# Patient Record
Sex: Male | Born: 1995 | Race: Black or African American | Hispanic: No | Marital: Married | Smoking: Never smoker
Health system: Southern US, Community
[De-identification: ages and names within clinical notes are randomized; demographics above are authoritative.]

## PROBLEM LIST (undated history)

## (undated) DIAGNOSIS — T7840XA Allergy, unspecified, initial encounter: Secondary | ICD-10-CM

## (undated) HISTORY — DX: Allergy, unspecified, initial encounter: T78.40XA

## (undated) HISTORY — PX: APPENDECTOMY: SHX54

---

## 2013-01-16 HISTORY — PX: APPENDECTOMY: SHX54

## 2013-10-03 ENCOUNTER — Emergency Department (HOSPITAL_COMMUNITY): Payer: BC Managed Care – PPO

## 2013-10-03 ENCOUNTER — Encounter (HOSPITAL_COMMUNITY): Payer: BC Managed Care – PPO | Admitting: Anesthesiology

## 2013-10-03 ENCOUNTER — Encounter (HOSPITAL_COMMUNITY)
Admission: EM | Disposition: A | Discharge status: Home / Self Care | Payer: Self-pay | Source: Home / Self Care | Attending: Emergency Medicine

## 2013-10-03 ENCOUNTER — Observation Stay (HOSPITAL_COMMUNITY)
Admission: EM | Admit: 2013-10-03 | Discharge: 2013-10-04 | Disposition: A | Discharge status: Home / Self Care | Payer: BC Managed Care – PPO | Attending: General Surgery | Admitting: General Surgery

## 2013-10-03 ENCOUNTER — Encounter (HOSPITAL_COMMUNITY): Payer: Self-pay | Admitting: Emergency Medicine

## 2013-10-03 ENCOUNTER — Emergency Department (HOSPITAL_COMMUNITY): Payer: BC Managed Care – PPO | Admitting: Anesthesiology

## 2013-10-03 DIAGNOSIS — Z91018 Allergy to other foods: Secondary | ICD-10-CM | POA: Diagnosis not present

## 2013-10-03 DIAGNOSIS — K3580 Unspecified acute appendicitis: Principal | ICD-10-CM | POA: Insufficient documentation

## 2013-10-03 HISTORY — DX: Unspecified acute appendicitis: K35.80

## 2013-10-03 HISTORY — PX: LAPAROSCOPIC APPENDECTOMY: SHX408

## 2013-10-03 LAB — CBC
HEMATOCRIT: 46.6 % (ref 39.0–52.0)
HEMOGLOBIN: 16.1 g/dL (ref 13.0–17.0)
MCH: 30 pg (ref 26.0–34.0)
MCHC: 34.5 g/dL (ref 30.0–36.0)
MCV: 86.8 fL (ref 78.0–100.0)
Platelets: 213 10*3/uL (ref 150–400)
RBC: 5.37 MIL/uL (ref 4.22–5.81)
RDW: 12.5 % (ref 11.5–15.5)
WBC: 9.4 10*3/uL (ref 4.0–10.5)

## 2013-10-03 LAB — URINALYSIS, ROUTINE W REFLEX MICROSCOPIC
Bilirubin Urine: NEGATIVE
GLUCOSE, UA: NEGATIVE mg/dL
Hgb urine dipstick: NEGATIVE
KETONES UR: NEGATIVE mg/dL
Leukocytes, UA: NEGATIVE
Nitrite: NEGATIVE
PH: 6 (ref 5.0–8.0)
Protein, ur: NEGATIVE mg/dL
Specific Gravity, Urine: 1.02 (ref 1.005–1.030)
Urobilinogen, UA: 0.2 mg/dL (ref 0.0–1.0)

## 2013-10-03 LAB — BASIC METABOLIC PANEL
Anion gap: 15 (ref 5–15)
BUN: 13 mg/dL (ref 6–23)
CALCIUM: 9.9 mg/dL (ref 8.4–10.5)
CO2: 26 meq/L (ref 19–32)
Chloride: 100 mEq/L (ref 96–112)
Creatinine, Ser: 1.11 mg/dL (ref 0.50–1.35)
GFR calc Af Amer: >90 mL/min (ref >90)
GFR calc non Af Amer: >90 mL/min (ref >90)
GLUCOSE: 85 mg/dL (ref 70–99)
Potassium: 3.5 mEq/L — ABNORMAL LOW (ref 3.7–5.3)
SODIUM: 141 meq/L (ref 137–147)

## 2013-10-03 SURGERY — APPENDECTOMY, LAPAROSCOPIC
Anesthesia: General

## 2013-10-03 MED ORDER — BUPIVACAINE HCL (PF) 0.5 % IJ SOLN
INTRAMUSCULAR | Status: AC
  Filled 2013-10-03: qty 30

## 2013-10-03 MED ORDER — LACTATED RINGERS IV SOLN: INTRAVENOUS | Status: DC

## 2013-10-03 MED ORDER — FENTANYL CITRATE 0.05 MG/ML IJ SOLN
INTRAMUSCULAR | Status: AC
  Filled 2013-10-03: qty 5

## 2013-10-03 MED ORDER — LIDOCAINE HCL (PF) 1 % IJ SOLN
INTRAMUSCULAR | Status: AC
  Filled 2013-10-03: qty 5

## 2013-10-03 MED ORDER — POVIDONE-IODINE 10 % EX OINT
TOPICAL_OINTMENT | CUTANEOUS | Status: AC
  Filled 2013-10-03: qty 1

## 2013-10-03 MED ORDER — SODIUM CHLORIDE 0.9 % IV SOLN
INTRAVENOUS | Status: DC | PRN
  Administered 2013-10-03: 21:00:00 via INTRAVENOUS

## 2013-10-03 MED ORDER — MIDAZOLAM HCL 5 MG/5ML IJ SOLN
INTRAMUSCULAR | Status: DC | PRN
  Administered 2013-10-03: 2 mg via INTRAVENOUS

## 2013-10-03 MED ORDER — SUCCINYLCHOLINE CHLORIDE 20 MG/ML IJ SOLN
INTRAMUSCULAR | Status: DC | PRN
  Administered 2013-10-03: 20 mg via INTRAVENOUS
  Administered 2013-10-03 (×2): 140 mg via INTRAVENOUS

## 2013-10-03 MED ORDER — ONDANSETRON HCL 4 MG/2ML IJ SOLN
INTRAMUSCULAR | Status: DC | PRN
  Administered 2013-10-03: 4 mg via INTRAVENOUS

## 2013-10-03 MED ORDER — KETOROLAC TROMETHAMINE 30 MG/ML IJ SOLN
30.0000 mg | Freq: Once | INTRAMUSCULAR | Status: AC
  Administered 2013-10-03: 30 mg via INTRAVENOUS
  Filled 2013-10-03: qty 1

## 2013-10-03 MED ORDER — FENTANYL CITRATE 0.05 MG/ML IJ SOLN
INTRAMUSCULAR | Status: DC | PRN
  Administered 2013-10-03: 50 ug via INTRAVENOUS
  Administered 2013-10-03: 100 ug via INTRAVENOUS
  Administered 2013-10-03 (×3): 50 ug via INTRAVENOUS

## 2013-10-03 MED ORDER — EPHEDRINE SULFATE 50 MG/ML IJ SOLN
INTRAMUSCULAR | Status: AC
Start: 2013-10-03 — End: 2013-10-03
  Filled 2013-10-03: qty 1

## 2013-10-03 MED ORDER — PROPOFOL 10 MG/ML IV BOLUS
INTRAVENOUS | Status: DC | PRN
  Administered 2013-10-03: 200 mg via INTRAVENOUS
  Administered 2013-10-03: 50 mg via INTRAVENOUS

## 2013-10-03 MED ORDER — LACTATED RINGERS IV SOLN
INTRAVENOUS | Status: DC | PRN
  Administered 2013-10-03: 21:00:00 via INTRAVENOUS

## 2013-10-03 MED ORDER — MORPHINE SULFATE 4 MG/ML IJ SOLN
2.0000 mg | Freq: Once | INTRAMUSCULAR | Status: AC
  Administered 2013-10-03: 2 mg via INTRAVENOUS
  Filled 2013-10-03: qty 1

## 2013-10-03 MED ORDER — ATROPINE SULFATE 0.4 MG/ML IJ SOLN
INTRAMUSCULAR | Status: AC
  Filled 2013-10-03: qty 1

## 2013-10-03 MED ORDER — BUPIVACAINE HCL (PF) 0.5 % IJ SOLN
INTRAMUSCULAR | Status: DC | PRN
  Administered 2013-10-03: 7 mL

## 2013-10-03 MED ORDER — MORPHINE SULFATE 4 MG/ML IJ SOLN
INTRAMUSCULAR | Status: AC
  Filled 2013-10-03: qty 1

## 2013-10-03 MED ORDER — IOHEXOL 300 MG/ML  SOLN
100.0000 mL | Freq: Once | INTRAMUSCULAR | Status: AC | PRN
  Administered 2013-10-03: 100 mL via INTRAVENOUS

## 2013-10-03 MED ORDER — SODIUM CHLORIDE 0.9 % IV SOLN
1.5000 g | Freq: Once | INTRAVENOUS | Status: AC
  Administered 2013-10-03: 1.5 g via INTRAVENOUS
  Filled 2013-10-03: qty 1.5

## 2013-10-03 MED ORDER — LIDOCAINE HCL (CARDIAC) 20 MG/ML IV SOLN
INTRAVENOUS | Status: DC | PRN
  Administered 2013-10-03: 50 mg via INTRAVENOUS

## 2013-10-03 MED ORDER — ROCURONIUM BROMIDE 50 MG/5ML IV SOLN
INTRAVENOUS | Status: AC
  Filled 2013-10-03: qty 1

## 2013-10-03 MED ORDER — SUCCINYLCHOLINE CHLORIDE 20 MG/ML IJ SOLN
INTRAMUSCULAR | Status: AC
  Filled 2013-10-03: qty 1

## 2013-10-03 MED ORDER — SODIUM CHLORIDE 0.9 % IJ SOLN
INTRAMUSCULAR | Status: AC
  Filled 2013-10-03: qty 10

## 2013-10-03 MED ORDER — HYDROMORPHONE HCL 1 MG/ML IJ SOLN: 1.0000 mg | INTRAMUSCULAR | Status: DC | PRN

## 2013-10-03 MED ORDER — POVIDONE-IODINE 10 % EX OINT
TOPICAL_OINTMENT | CUTANEOUS | Status: DC | PRN
  Administered 2013-10-03: 1 via TOPICAL

## 2013-10-03 MED ORDER — MIDAZOLAM HCL 2 MG/2ML IJ SOLN
INTRAMUSCULAR | Status: AC
  Filled 2013-10-03: qty 2

## 2013-10-03 MED ORDER — ONDANSETRON HCL 4 MG PO TABS: 4.0000 mg | ORAL_TABLET | Freq: Four times a day (QID) | ORAL | Status: DC | PRN

## 2013-10-03 MED ORDER — ENOXAPARIN SODIUM 40 MG/0.4ML ~~LOC~~ SOLN
40.0000 mg | SUBCUTANEOUS | Status: DC
  Administered 2013-10-04: 40 mg via SUBCUTANEOUS
  Filled 2013-10-03: qty 0.4

## 2013-10-03 MED ORDER — MORPHINE SULFATE 4 MG/ML IJ SOLN
4.0000 mg | Freq: Once | INTRAMUSCULAR | Status: AC
  Administered 2013-10-03: 4 mg via INTRAVENOUS

## 2013-10-03 MED ORDER — PIPERACILLIN-TAZOBACTAM 3.375 G IVPB
3.3750 g | Freq: Three times a day (TID) | INTRAVENOUS | Status: DC
  Administered 2013-10-04 (×2): 3.375 g via INTRAVENOUS
  Filled 2013-10-03 (×8): qty 50

## 2013-10-03 MED ORDER — NEOSTIGMINE METHYLSULFATE 10 MG/10ML IV SOLN
INTRAVENOUS | Status: DC | PRN
  Administered 2013-10-03: 4 mg via INTRAVENOUS

## 2013-10-03 MED ORDER — OXYCODONE-ACETAMINOPHEN 5-325 MG PO TABS: 1.0000 | ORAL_TABLET | ORAL | Status: DC | PRN

## 2013-10-03 MED ORDER — ONDANSETRON HCL 4 MG/2ML IJ SOLN: 4.0000 mg | Freq: Four times a day (QID) | INTRAMUSCULAR | Status: DC | PRN

## 2013-10-03 MED ORDER — SODIUM CHLORIDE 0.9 % IR SOLN
Status: DC | PRN
  Administered 2013-10-03: 1000 mL

## 2013-10-03 MED ORDER — IOHEXOL 300 MG/ML  SOLN
50.0000 mL | Freq: Once | INTRAMUSCULAR | Status: AC | PRN
  Administered 2013-10-03: 50 mL via ORAL

## 2013-10-03 MED ORDER — SODIUM CHLORIDE 0.9 % IV SOLN
Freq: Once | INTRAVENOUS | Status: AC
  Administered 2013-10-03: 18:00:00 via INTRAVENOUS

## 2013-10-03 MED ORDER — GLYCOPYRROLATE 0.2 MG/ML IJ SOLN
INTRAMUSCULAR | Status: DC | PRN
  Administered 2013-10-03: 0.6 mg via INTRAVENOUS

## 2013-10-03 MED ORDER — PROPOFOL 10 MG/ML IV EMUL
INTRAVENOUS | Status: AC
  Filled 2013-10-03: qty 40

## 2013-10-03 SURGICAL SUPPLY — 50 items
BAG HAMPER (MISCELLANEOUS) ×3 IMPLANT
CLOTH BEACON ORANGE TIMEOUT ST (SAFETY) ×3 IMPLANT
COVER LIGHT HANDLE STERIS (MISCELLANEOUS) ×6 IMPLANT
CUTTER FLEX LINEAR 45M (STAPLE) IMPLANT
CUTTER LINEAR ENDO 35 ETS (STAPLE) IMPLANT
CUTTER LINEAR ENDO 35 ETS TH (STAPLE) ×3 IMPLANT
DECANTER SPIKE VIAL GLASS SM (MISCELLANEOUS) ×3 IMPLANT
DISSECTOR BLUNT TIP ENDO 5MM (MISCELLANEOUS) IMPLANT
DURAPREP 26ML APPLICATOR (WOUND CARE) ×3 IMPLANT
ELECT REM PT RETURN 9FT ADLT (ELECTROSURGICAL) ×3
ELECTRODE REM PT RTRN 9FT ADLT (ELECTROSURGICAL) ×1 IMPLANT
FILTER SMOKE EVAC LAPAROSHD (FILTER) ×3 IMPLANT
FORMALIN 10 PREFIL 120ML (MISCELLANEOUS) ×3 IMPLANT
GLOVE BIOGEL PI IND STRL 7.0 (GLOVE) ×1 IMPLANT
GLOVE BIOGEL PI IND STRL 7.5 (GLOVE) ×3 IMPLANT
GLOVE BIOGEL PI INDICATOR 7.0 (GLOVE) ×2
GLOVE BIOGEL PI INDICATOR 7.5 (GLOVE) ×6
GLOVE ECLIPSE 7.0 STRL STRAW (GLOVE) ×3 IMPLANT
GLOVE SURG SS PI 7.5 STRL IVOR (GLOVE) ×6 IMPLANT
GOWN STRL REUS W/ TWL XL LVL3 (GOWN DISPOSABLE) ×1 IMPLANT
GOWN STRL REUS W/TWL LRG LVL3 (GOWN DISPOSABLE) ×3 IMPLANT
GOWN STRL REUS W/TWL XL LVL3 (GOWN DISPOSABLE) ×2
INST SET LAPROSCOPIC AP (KITS) ×3 IMPLANT
IV NS IRRIG 3000ML ARTHROMATIC (IV SOLUTION) IMPLANT
KIT ROOM TURNOVER APOR (KITS) ×3 IMPLANT
MANIFOLD NEPTUNE II (INSTRUMENTS) ×3 IMPLANT
NEEDLE INSUFFLATION 14GA 120MM (NEEDLE) ×3 IMPLANT
NS IRRIG 1000ML POUR BTL (IV SOLUTION) ×3 IMPLANT
PACK LAP CHOLE LZT030E (CUSTOM PROCEDURE TRAY) ×3 IMPLANT
PAD ARMBOARD 7.5X6 YLW CONV (MISCELLANEOUS) ×3 IMPLANT
PENCIL HANDSWITCHING (ELECTRODE) ×3 IMPLANT
POUCH SPECIMEN RETRIEVAL 10MM (ENDOMECHANICALS) ×3 IMPLANT
RELOAD /EVU35 (ENDOMECHANICALS) IMPLANT
RELOAD 45 VASCULAR/THIN (ENDOMECHANICALS) IMPLANT
RELOAD CUTTER ETS 35MM STAND (ENDOMECHANICALS) IMPLANT
SCALPEL HARMONIC ACE (MISCELLANEOUS) ×3 IMPLANT
SET BASIN LINEN APH (SET/KITS/TRAYS/PACK) ×3 IMPLANT
SET TUBE IRRIG SUCTION NO TIP (IRRIGATION / IRRIGATOR) IMPLANT
SPONGE GAUZE 2X2 8PLY STER LF (GAUZE/BANDAGES/DRESSINGS) ×3
SPONGE GAUZE 2X2 8PLY STRL LF (GAUZE/BANDAGES/DRESSINGS) ×6 IMPLANT
STAPLER VISISTAT (STAPLE) ×3 IMPLANT
SUT VICRYL 0 UR6 27IN ABS (SUTURE) ×3 IMPLANT
TAPE CLOTH SURG 4X10 WHT LF (GAUZE/BANDAGES/DRESSINGS) ×3 IMPLANT
TRAY FOLEY CATH 16FR SILVER (SET/KITS/TRAYS/PACK) ×3 IMPLANT
TROCAR ENDO BLADELESS 11MM (ENDOMECHANICALS) ×3 IMPLANT
TROCAR ENDO BLADELESS 12MM (ENDOMECHANICALS) ×3 IMPLANT
TROCAR XCEL NON-BLD 5MMX100MML (ENDOMECHANICALS) ×3 IMPLANT
TUBING INSUFFLATION (TUBING) ×3 IMPLANT
WARMER LAPAROSCOPE (MISCELLANEOUS) ×3 IMPLANT
YANKAUER SUCT 12FT TUBE ARGYLE (SUCTIONS) ×3 IMPLANT

## 2013-10-03 NOTE — Anesthesia Postprocedure Evaluation (Signed)
  Anesthesia Post-op Note  Patient: Ricky Murphy  Procedure(s) Performed: Procedure(s): APPENDECTOMY LAPAROSCOPIC (N/A)  Patient Location: PACU  Anesthesia Type:General  Level of Consciousness: sedated and patient cooperative  Airway and Oxygen Therapy: Patient Spontanous Breathing and Patient connected to face mask oxygen  Post-op Pain: mild  Post-op Assessment: Post-op Vital signs reviewed, Patient's Cardiovascular Status Stable, Respiratory Function Stable, Patent Airway, No signs of Nausea or vomiting and Pain level controlled  Post-op Vital Signs: Reviewed and stable  Last Vitals:  Filed Vitals:   10/03/13 1840  BP: 129/71  Pulse: 86  Temp: 37.7 C  Resp: 18    Complications: No apparent anesthesia complications

## 2013-10-03 NOTE — ED Notes (Signed)
Patient to have surgery this evening as per order. Consent signed, family at bedside

## 2013-10-03 NOTE — Anesthesia Preprocedure Evaluation (Signed)
Anesthesia Evaluation  Patient identified by MRN, date of birth, ID band Patient awake    Reviewed: Allergy & Precautions, H&P , NPO status , Patient's Chart, lab work & pertinent test results  History of Anesthesia Complications Negative for: history of anesthetic complications  Airway Mallampati: II TM Distance: >3 FB Neck ROM: full    Dental  (+) Teeth Intact   Pulmonary    Pulmonary exam normal       Cardiovascular Rhythm:regular Rate:Normal     Neuro/Psych    GI/Hepatic   Endo/Other    Renal/GU      Musculoskeletal   Abdominal   Peds  Hematology   Anesthesia Other Findings   Reproductive/Obstetrics                           Anesthesia Physical Anesthesia Plan  ASA: I and emergent  Anesthesia Plan: General ETT, Rapid Sequence and Cricoid Pressure   Post-op Pain Management:    Induction:   Airway Management Planned:   Additional Equipment:   Intra-op Plan:   Post-operative Plan:   Informed Consent: I have reviewed the patients History and Physical, chart, labs and discussed the procedure including the risks, benefits and alternatives for the proposed anesthesia with the patient or authorized representative who has indicated his/her understanding and acceptance.   Dental Advisory Given  Plan Discussed with: Surgeon and Anesthesiologist  Anesthesia Plan Comments:         Anesthesia Quick Evaluation

## 2013-10-03 NOTE — Transfer of Care (Signed)
Immediate Anesthesia Transfer of Care Note  Patient: Ricky Murphy  Procedure(s) Performed: Procedure(s): APPENDECTOMY LAPAROSCOPIC (N/A)  Patient Location: PACU  Anesthesia Type:General  Level of Consciousness: sedated and patient cooperative  Airway & Oxygen Therapy: Patient Spontanous Breathing and Patient connected to face mask oxygen  Post-op Assessment: Report given to PACU RN and Post -op Vital signs reviewed and stable  Post vital signs: Reviewed and stable  Complications: No apparent anesthesia complications

## 2013-10-03 NOTE — ED Notes (Signed)
Pt reports right sided abdominal pain that started at 6am this am. nad noted. Pt reports nausea. nad noted.

## 2013-10-03 NOTE — ED Notes (Signed)
Operating room staff at bedside with patient and family at this time

## 2013-10-03 NOTE — Progress Notes (Signed)
Incentive placed in room, PT asleep

## 2013-10-03 NOTE — ED Provider Notes (Signed)
CSN: [id number]     Arrival date & time 10/03/13  1659 History   First MD Initiated Contact with Patient 10/03/13 1713     Chief Complaint  Patient presents with  . Abdominal Pain     (Consider location/radiation/quality/duration/timing/severity/associated sxs/prior Treatment) HPI Comments: The pt is a 18 y/o male - no PMH - presents with RLQ abd pain - started this AM, onset was acute - constant throughout day and associated with 3 episodes of watery non bloody diarrhea and mild nausea - he has no fevers.  yesterday evening he developed URI sx with nasal congestion, sore throat, mild cough and headache.  These are still present today.  He has no hx of abd surgery.  Patient is a 18 y.o. male presenting with abdominal pain. The history is provided by the patient.  Abdominal Pain   History reviewed. No pertinent past medical history. History reviewed. No pertinent past surgical history. History reviewed. No pertinent family history. History  Substance Use Topics  . Smoking status: Never Smoker   . Smokeless tobacco: Not on file  . Alcohol Use: No    Review of Systems  Gastrointestinal: Positive for abdominal pain.  All other systems reviewed and are negative.     Allergies  Coconut flavor  Home Medications   Prior to Admission medications   Not on File   BP 129/71  Pulse 86  Temp(Src) 99.9 F (37.7 C) (Oral)  Resp 18  Ht  (1.727 m)  Wt 245 lb (111.131 kg)  BMI 37.26 kg/m2  SpO2 98% Physical Exam  Nursing note and vitals reviewed. Constitutional: He appears well-developed and well-nourished. No distress.  HENT:  Head: Normocephalic and atraumatic.  Nose: Nose normal.  Mouth/Throat: Mucous membranes are normal. No oral lesions. No trismus in the jaw. No uvula swelling. Posterior oropharyngeal erythema present. No oropharyngeal exudate, posterior oropharyngeal edema or tonsillar abscesses.  Eyes: Conjunctivae and EOM are normal. Pupils are equal, round, and  reactive to light. Right eye exhibits no discharge. Left eye exhibits no discharge. No scleral icterus.  Neck: Normal range of motion. Neck supple. No JVD present. No thyromegaly present.  Cardiovascular: Normal rate, regular rhythm, normal heart sounds and intact distal pulses.  Exam reveals no gallop and no friction rub.   No murmur heard. Pulmonary/Chest: Effort normal and breath sounds normal. No respiratory distress. He has no wheezes. He has no rales.  Abdominal: Soft. Bowel sounds are normal. He exhibits no distension and no mass. There is no hepatosplenomegaly. There is tenderness in the right lower quadrant. There is tenderness at McBurney's point. There is no rigidity, no rebound, no guarding, no CVA tenderness and negative Murphy's sign. No hernia.  Musculoskeletal: Normal range of motion. He exhibits no edema and no tenderness.  Lymphadenopathy:    He has no cervical adenopathy.  Neurological: He is alert. Coordination normal.  Skin: Skin is warm and dry. No rash noted. No erythema.  Psychiatric: He has a normal mood and affect. His behavior is normal.    ED Course  Procedures (including critical care time) Labs Review Labs Reviewed  BASIC METABOLIC PANEL - Abnormal; Notable for the following:    Potassium 3.5 (*)    All other components within normal limits  CBC  URINALYSIS, ROUTINE W REFLEX MICROSCOPIC    Imaging Review Ct Abdomen Pelvis W Contrast  10/03/2013   CLINICAL DATA:  Right lower quadrant pain  EXAM: CT ABDOMEN AND PELVIS WITH CONTRAST  TECHNIQUE: Multidetector CT imaging of  the abdomen and pelvis was performed using the standard protocol following bolus administration of intravenous contrast.  CONTRAST:  OMNIPAQUE IOHEXOL 300 MG/ML  SOLN  COMPARISON:  None.  FINDINGS: Lower chest:  Lung bases are clear.  Hepatobiliary: Liver is within normal limits.  Gallbladder is unremarkable. No intrahepatic or extrahepatic ductal dilatation.  Spleen: Within normal limits.   Pancreas: Within normal limits.  Stomach/Bowel: Stomach is unremarkable.  No evidence of bowel obstruction.  Distal appendix is mildly dilated, measuring 9 mm, with associated mild periappendiceal stranding/ inflammatory changes (series 2/image 64). This appearance is worrisome for early acute appendicitis.  No drainable fluid collection/ abscess.  No free air.  Adrenals/urinary tract: Adrenal glands are unremarkable.  Kidneys are within normal limits.  No hydronephrosis.  Bladder is underdistended.  Vascular/Lymphatic: No evidence of abdominal aortic aneurysm.  No suspicious abdominopelvic lymphadenopathy.  Reproductive: Process is unremarkable.  Musculoskeletal: Visualized osseous structures are within normal limits.  Other: No abdominopelvic ascites.  IMPRESSION: Suspected early acute appendicitis.  No drainable fluid collection/abscess.  No free air.   Electronically Signed   By: Charline Bills M.D.   On: 10/03/2013 18:18      MDM   Final diagnoses:  Acute appendicitis, unspecified acute appendicitis type    At this time though the pt has had URI sx and has had diarrhea, he has pain at McB point raising concern for appendicitis.  He does not have an acute abdomen.  His VS are normal - he has agreed to CT to r/o appendicitis.  Pain meds and fluids ordered as below.    CT scan results at 6:45 PM, consistent with acute appendicitis, patient reevaluated, no peritoneal signs, white blood cell count slightly elevated, these findings were discussed with Dr. Lovell Sheehan. Pain medications ordered, n.p.o. status verified, antibiotics ordered as below.  Meds given in ED:  Medications  ampicillin-sulbactam (UNASYN) 1.5 g in sodium chloride 0.9 % 50 mL IVPB (1.5 g Intravenous New Bag/Given 10/03/13 1852)  morphine 4 MG/ML injection (not administered)  morphine 4 MG/ML injection 2 mg (2 mg Intravenous Given 10/03/13 1735)  0.9 %  sodium chloride infusion ( Intravenous New Bag/Given 10/03/13 1735)  iohexol  (OMNIPAQUE) 300 MG/ML solution 50 mL (50 mLs Oral Contrast Given 10/03/13 1725)  iohexol (OMNIPAQUE) 300 MG/ML solution 100 mL (100 mLs Intravenous Contrast Given 10/03/13 1759)  morphine 4 MG/ML injection 4 mg (4 mg Intravenous Given 10/03/13 1853)    New Prescriptions   No medications on file        Vida Roller, MD 10/03/13 [phone]

## 2013-10-03 NOTE — Anesthesia Procedure Notes (Signed)
Procedure Name: Intubation Date/Time: 10/03/2013 8:36 PM Performed by: Pernell Dupre, AMY A Pre-anesthesia Checklist: Patient identified, Patient being monitored, Timeout performed, Emergency Drugs available and Suction available Patient Re-evaluated:Patient Re-evaluated prior to inductionOxygen Delivery Method: Circle System Utilized Preoxygenation: Pre-oxygenation with 100% oxygen Intubation Type: IV induction, Rapid sequence and Cricoid Pressure applied Laryngoscope Size: 3 and Miller Grade View: Grade I Tube type: Oral Tube size: 7.0 mm Number of attempts: 1 Airway Equipment and Method: stylet Placement Confirmation: ETT inserted through vocal cords under direct vision,  positive ETCO2 and breath sounds checked- equal and bilateral Secured at: 21 cm Tube secured with: Tape Dental Injury: Teeth and Oropharynx as per pre-operative assessment

## 2013-10-03 NOTE — Op Note (Signed)
Patient:  Ricky Murphy  DOB:  08-11-1995  MRN:  [id number]   Preop Diagnosis:  Acute appendicitis  Postop Diagnosis:  Same  Procedure:  Laparoscopic appendectomy  Surgeon:  Franky Macho, M.D.  Anes:  General endotracheal  Indications:  Patient is a 18 year old black male who presents with a less than 24-hour history of worsening right lower quadrant abdominal pain. CT scan the abdomen reveals acute appendicitis. The risks and benefits of the procedure including bleeding, infection, and the possibility of an open procedure were fully explained to the patient, who gave informed consent.  Procedure note:  Patient is placed the supine position. After induction of general endotracheal anesthesia, the abdomen was prepped and draped using usual sterile technique with DuraPrep. Surgical site confirmation was performed.  A supraumbilical incision was made down the fascia. A Veress needle was introduced into the abdominal cavity and confirmation of placement was done using the saline drop test. The abdomen was then insufflated to 16 mm mercury pressure. An 11 mm trocar was introduced into the abdominal cavity under direct visualization without difficulty. The patient was placed in deeper Trendelenburg position and an additional 12 mm trocar was placed the suprapubic region and a 5 mm trocar was placed left lower quadrant region. The appendix was visualized and distal half was noted to be injected and swollen. The mesoappendix was divided using the harmonic scalpel. A vascular Endo GIA was placed across the base the appendix and fired. The appendix was then removed using an Endo Catch bag without difficulty. The staple line was inspected and noted to be within normal limits. All fluid and air were then evacuated from the abdominal cavity prior to removal of the trochars.  All wounds were irrigated with normal saline. All wounds were checked with 0.5% Sensorcaine. The supraumbilical fascia was  reapproximated using an 0 Vicryl interrupted suture. All skin incisions were closed using staples. Ointment and dry sterile dressings were applied.  All tape and needle counts were correct at the end of the procedure. Patient was extubated in the operating room and transferred to PACU in stable condition.  Complications:  None  EBL:  Minimal  Specimen:  Appendix

## 2013-10-03 NOTE — H&P (Signed)
Ricky Murphy is an 18 y.o. male.   Chief Complaint: Abdominal pain and generalized malaise HPI: Patient is 18 year old black male who presents with a less than 24-hour history of worsening abdominal pain. CT scan the abdomen reveals early acute appendicitis.  History reviewed. No pertinent past medical history.  History reviewed. No pertinent past surgical history.  History reviewed. No pertinent family history. Social History:  reports that he has never smoked. He does not have any smokeless tobacco history on file. He reports that he uses illicit drugs. He reports that he does not drink alcohol.  Allergies:  Allergies  Allergen Reactions  . Coconut Flavor Shortness Of Breath     (Not in a hospital admission)  Results for orders placed during the hospital encounter of 10/03/13 (from the past 48 hour(s))  CBC     Status: None   Collection Time    10/03/13  5:25 PM      Result Value Ref Range   WBC 9.4  4.0 - 10.5 K/uL   RBC 5.37  4.22 - 5.81 MIL/uL   Hemoglobin 16.1  13.0 - 17.0 g/dL   HCT 46.6  39.0 - 52.0 %   MCV 86.8  78.0 - 100.0 fL   MCH 30.0  26.0 - 34.0 pg   MCHC 34.5  30.0 - 36.0 g/dL   RDW 12.5  11.5 - 15.5 %   Platelets 213  150 - 400 K/uL  BASIC METABOLIC PANEL     Status: Abnormal   Collection Time    10/03/13  5:25 PM      Result Value Ref Range   Sodium 141  137 - 147 mEq/L   Potassium 3.5 (*) 3.7 - 5.3 mEq/L   Chloride 100  96 - 112 mEq/L   CO2 26  19 - 32 mEq/L   Glucose, Bld 85  70 - 99 mg/dL   BUN 13  6 - 23 mg/dL   Creatinine, Ser 1.11  0.50 - 1.35 mg/dL   Calcium 9.9  8.4 - 10.5 mg/dL   GFR calc non Af Amer >90  >90 mL/min   GFR calc Af Amer >90  >90 mL/min   Comment: (NOTE)     The eGFR has been calculated using the CKD EPI equation.     This calculation has not been validated in all clinical situations.     eGFR's persistently <90 mL/min signify possible Chronic Kidney     Disease.   Anion gap 15  5 - 15  URINALYSIS, ROUTINE W REFLEX  MICROSCOPIC     Status: None   Collection Time    10/03/13  5:41 PM      Result Value Ref Range   Color, Urine YELLOW  YELLOW   APPearance CLEAR  CLEAR   Specific Gravity, Urine 1.020  1.005 - 1.030   pH 6.0  5.0 - 8.0   Glucose, UA NEGATIVE  NEGATIVE mg/dL   Hgb urine dipstick NEGATIVE  NEGATIVE   Bilirubin Urine NEGATIVE  NEGATIVE   Ketones, ur NEGATIVE  NEGATIVE mg/dL   Protein, ur NEGATIVE  NEGATIVE mg/dL   Urobilinogen, UA 0.2  0.0 - 1.0 mg/dL   Nitrite NEGATIVE  NEGATIVE   Leukocytes, UA NEGATIVE  NEGATIVE   Comment: MICROSCOPIC NOT DONE ON URINES WITH NEGATIVE PROTEIN, BLOOD, LEUKOCYTES, NITRITE, OR GLUCOSE <1000 mg/dL.   Ct Abdomen Pelvis W Contrast  10/03/2013   CLINICAL DATA:  Right lower quadrant pain  EXAM: CT ABDOMEN AND PELVIS WITH CONTRAST  TECHNIQUE: Multidetector CT imaging of the abdomen and pelvis was performed using the standard protocol following bolus administration of intravenous contrast.  CONTRAST:  168m OMNIPAQUE IOHEXOL 300 MG/ML  SOLN  COMPARISON:  None.  FINDINGS: Lower chest:  Lung bases are clear.  Hepatobiliary: Liver is within normal limits.  Gallbladder is unremarkable. No intrahepatic or extrahepatic ductal dilatation.  Spleen: Within normal limits.  Pancreas: Within normal limits.  Stomach/Bowel: Stomach is unremarkable.  No evidence of bowel obstruction.  Distal appendix is mildly dilated, measuring 9 mm, with associated mild periappendiceal stranding/ inflammatory changes (series 2/image 64). This appearance is worrisome for early acute appendicitis.  No drainable fluid collection/ abscess.  No free air.  Adrenals/urinary tract: Adrenal glands are unremarkable.  Kidneys are within normal limits.  No hydronephrosis.  Bladder is underdistended.  Vascular/Lymphatic: No evidence of abdominal aortic aneurysm.  No suspicious abdominopelvic lymphadenopathy.  Reproductive: Process is unremarkable.  Musculoskeletal: Visualized osseous structures are within normal  limits.  Other: No abdominopelvic ascites.  IMPRESSION: Suspected early acute appendicitis.  No drainable fluid collection/abscess.  No free air.   Electronically Signed   By: SJulian HyM.D.   On: 10/03/2013 18:18    Review of Systems  Constitutional: Positive for malaise/fatigue.  HENT: Negative.   Respiratory: Negative.   Cardiovascular: Negative.   Gastrointestinal: Positive for nausea and abdominal pain.  Genitourinary: Negative.   Skin: Negative.     Blood pressure 129/71, pulse 86, temperature 99.9 F (37.7 C), temperature source Oral, resp. rate 18, height 5' 8"  (1.727 m), weight 111.131 kg (245 lb), SpO2 98.00%. Physical Exam  Vitals reviewed. Constitutional: He is oriented to person, place, and time. He appears well-developed and well-nourished.  HENT:  Head: Normocephalic and atraumatic.  Neck: Normal range of motion. Neck supple.  Cardiovascular: Normal rate, regular rhythm and normal heart sounds.   Respiratory: Effort normal and breath sounds normal.  GI: Soft. He exhibits no distension. There is tenderness.  Tender in the right lower quadrant to deep palpation. No rigidity noted.  Neurological: He is alert and oriented to person, place, and time.  Skin: Skin is warm and dry.     Assessment/Plan Impression: Acute appendicitis Plan: Patient be taken to the operating room for laparoscopic appendectomy. The risks and benefits of the procedure including bleeding, infection, and the possibility of an open procedure were fully explained to the patient, who gave informed consent.  Alexandria Shiflett A 10/03/2013, 7:55 PM

## 2013-10-04 MED ORDER — PIPERACILLIN-TAZOBACTAM 3.375 G IVPB
INTRAVENOUS | Status: AC
  Filled 2013-10-04: qty 50

## 2013-10-04 MED ORDER — OXYCODONE-ACETAMINOPHEN 7.5-325 MG PO TABS: 1.0000 | ORAL_TABLET | ORAL | Status: AC | PRN

## 2013-10-04 NOTE — Progress Notes (Signed)
Patient with orders to be discharge home. Discharge instructions given. Patient verbalized understanding. Prescriptions given. Patient stable. Patient left in private vehicle with family.

## 2013-10-04 NOTE — Discharge Summary (Signed)
Physician Discharge Summary  Patient ID: Ricky Murphy MRN: [id number] DOB/AGE: December 01, 1995 18 y.o.  Admit date: 10/03/2013 Discharge date: 10/04/2013  Admission Diagnoses: Acute appendicitis  Discharge Diagnoses: Same Active Problems:   Acute appendicitis   Discharged Condition: good  Hospital Course: Patient is a 18 year old black male who presented emergency room with a less than 24-hour history of worsening lower abdominal pain. CT scan the abdomen revealed acute appendicitis. He went to the operating room on 10/03/2013 and underwent a laparoscopic appendectomy. He tolerated the procedure well. His postoperative course has been unremarkable. His diet was advanced without difficulty. The patient is being discharged home in good improving condition.  Treatments: surgery: Laparoscopic appendectomy on 10/03/2013  Discharge Exam: Blood pressure 130/52, pulse 70, temperature 98.4 F (36.9 C), temperature source Oral, resp. rate 11, height  (1.727 m), weight 111.131 kg (245 lb), SpO2 96.00%. General appearance: alert, cooperative and no distress Resp: clear to auscultation bilaterally Cardio: regular rate and rhythm, S1, S2 normal, no murmur, click, rub or gallop GI: Soft, incisions healing well.  Disposition: Home    Medication List         oxyCODONE-acetaminophen 7.5-325 MG per tablet  Commonly known as:  PERCOCET  Take 1-2 tablets by mouth every 4 (four) hours as needed.           Follow-up Information   Follow up with Dalia Heading, MD. Schedule an appointment as soon as possible for a visit on 10/14/2013.   Specialty:  General Surgery   Contact information:   [address] [place] [address] [phone]       Signed: Franky Macho A 10/04/2013, 12:26 PM

## 2013-10-04 NOTE — Discharge Instructions (Signed)
Laparoscopic Appendectomy °Care After °Refer to this sheet in the next few weeks. These instructions provide you with information on caring for yourself after your procedure. Your caregiver may also give you more specific instructions. Your treatment has been planned according to current medical practices, but problems sometimes occur. Call your caregiver if you have any problems or questions after your procedure. °HOME CARE INSTRUCTIONS °· Do not drive while taking narcotic pain medicines. °· Use stool softener if you become constipated from your pain medicines. °· Change your bandages (dressings) as directed. °· Keep your wounds clean and dry. You may wash the wounds gently with soap and water. Gently pat the wounds dry with a clean towel. °· Do not take baths, swim, or use hot tubs for 10 days, or as instructed by your caregiver. °· Only take over-the-counter or prescription medicines for pain, discomfort, or fever as directed by your caregiver. °· You may continue your normal diet as directed. °· Do not lift more than 10 pounds (4.5 kg) or play contact sports for 3 weeks, or as directed. °· Slowly increase your activity after surgery. °· Take deep breaths to avoid getting a lung infection (pneumonia). °SEEK MEDICAL CARE IF: °· You have redness, swelling, or increasing pain in your wounds. °· You have pus coming from your wounds. °· You have drainage from a wound that lasts longer than 1 day. °· You notice a bad smell coming from the wounds or dressing. °· Your wound edges break open after stitches (sutures) have been removed. °· You notice increasing pain in the shoulders (shoulder strap areas) or near your shoulder blades. °· You develop dizzy episodes or fainting while standing. °· You develop shortness of breath. °· You develop persistent nausea or vomiting. °· You cannot control your bowel functions or lose your appetite. °· You develop diarrhea. °SEEK IMMEDIATE MEDICAL CARE IF:  °· You have a fever. °· You  develop a rash. °· You have difficulty breathing or sharp pains in your chest. °· You develop any reaction or side effects to medicines given. °MAKE SURE YOU: °· Understand these instructions. °· Will watch your condition. °· Will get help right away if you are not doing well or get worse. °Document Released: 01/02/2005 Document Revised: 03/27/2011 Document Reviewed: 07/12/2010 °[employer]® Patient Information ©2015 [employer]. This information is not intended to replace advice given to you by your health care provider. Make sure you discuss any questions you have with your health care provider. ° °

## 2013-10-04 NOTE — Progress Notes (Signed)
UR completed 

## 2013-10-06 ENCOUNTER — Encounter (HOSPITAL_COMMUNITY): Payer: Self-pay | Admitting: General Surgery

## 2014-10-04 ENCOUNTER — Encounter (HOSPITAL_COMMUNITY): Payer: Self-pay | Admitting: Emergency Medicine

## 2014-10-04 ENCOUNTER — Emergency Department (HOSPITAL_COMMUNITY)
Admission: EM | Admit: 2014-10-04 | Discharge: 2014-10-05 | Disposition: A | Discharge status: Home / Self Care | Payer: BLUE CROSS/BLUE SHIELD | Attending: Emergency Medicine | Admitting: Emergency Medicine

## 2014-10-04 DIAGNOSIS — S3992XA Unspecified injury of lower back, initial encounter: Secondary | ICD-10-CM | POA: Diagnosis present

## 2014-10-04 DIAGNOSIS — Y9389 Activity, other specified: Secondary | ICD-10-CM | POA: Insufficient documentation

## 2014-10-04 DIAGNOSIS — M5440 Lumbago with sciatica, unspecified side: Secondary | ICD-10-CM

## 2014-10-04 DIAGNOSIS — Y998 Other external cause status: Secondary | ICD-10-CM | POA: Diagnosis not present

## 2014-10-04 DIAGNOSIS — Y92410 Unspecified street and highway as the place of occurrence of the external cause: Secondary | ICD-10-CM | POA: Insufficient documentation
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
## 2014-10-05 ENCOUNTER — Emergency Department (HOSPITAL_COMMUNITY): Payer: BLUE CROSS/BLUE SHIELD

## 2014-10-05 MED ORDER — NAPROXEN 500 MG PO TABS: 500.0000 mg | ORAL_TABLET | Freq: Two times a day (BID) | ORAL | Status: AC

## 2014-10-05 MED ORDER — CYCLOBENZAPRINE HCL 10 MG PO TABS
10.0000 mg | ORAL_TABLET | Freq: Once | ORAL | Status: AC
  Administered 2014-10-05: 10 mg via ORAL
  Filled 2014-10-05: qty 1

## 2014-10-05 MED ORDER — CYCLOBENZAPRINE HCL 10 MG PO TABS: 10.0000 mg | ORAL_TABLET | Freq: Two times a day (BID) | ORAL | Status: AC | PRN

## 2015-09-06 IMAGING — CT CT ABD-PELV W/ CM
2 of 4 series · 16 of 46 positions shown, 18 images · IV contrast (Omnipaque 300)
Comparison: None.

CLINICAL DATA: Right lower quadrant pain

EXAM:
CT ABDOMEN AND PELVIS WITH CONTRAST
TECHNIQUE: Multidetector CT imaging of the abdomen and pelvis was performed
using the standard protocol following bolus administration of
intravenous contrast.
CONTRAST:  100mL OMNIPAQUE IOHEXOL 300 MG/ML  SOLN

[Series 2: abd_pel_with 5.0 b40f · axial · 0.84mm/px · z∈[-467,-52]mm · 13 of 93 slices shown, 15 images]
[im 5/93  soft-tissue]
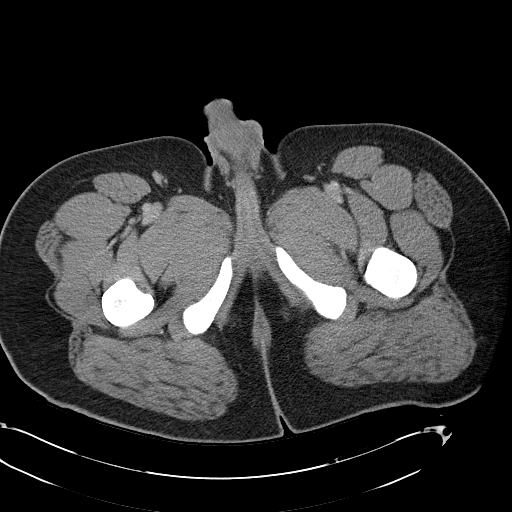
[im 5/93  bone]
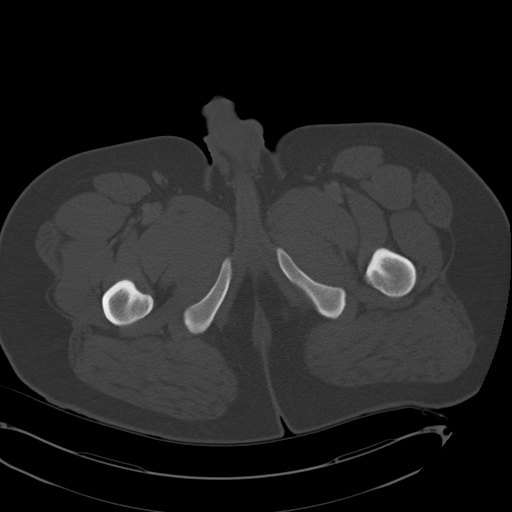
[im 13/93  soft-tissue]
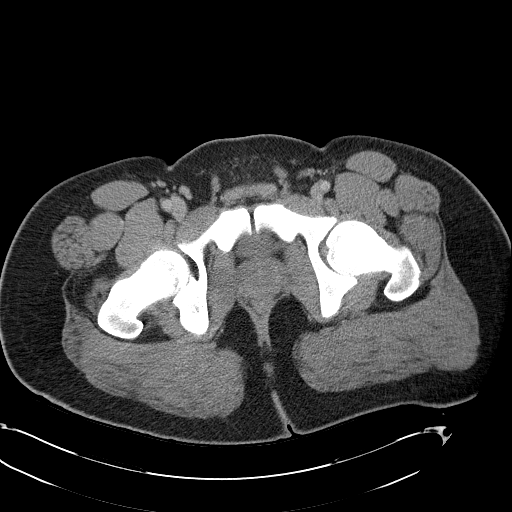
[im 21/93  soft-tissue]
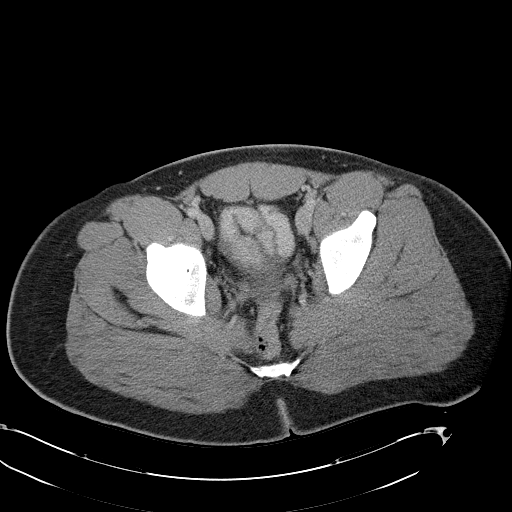
[im 26/93  soft-tissue]
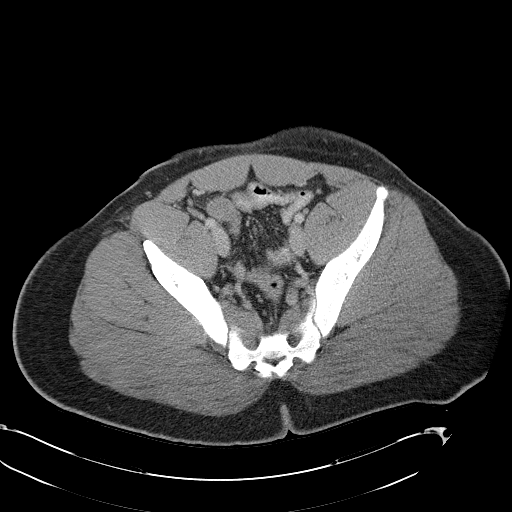
[im 34/93  soft-tissue]
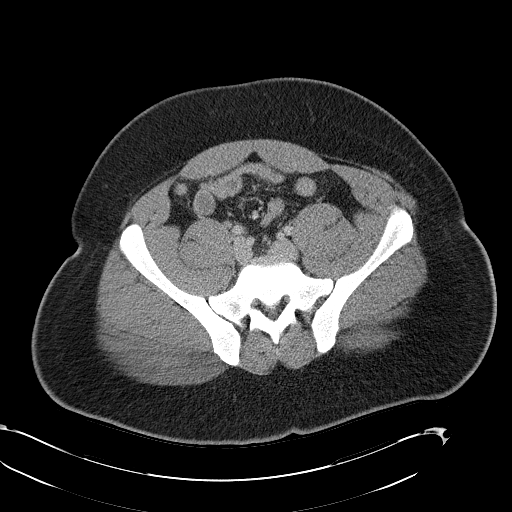
[im 38/93  soft-tissue]
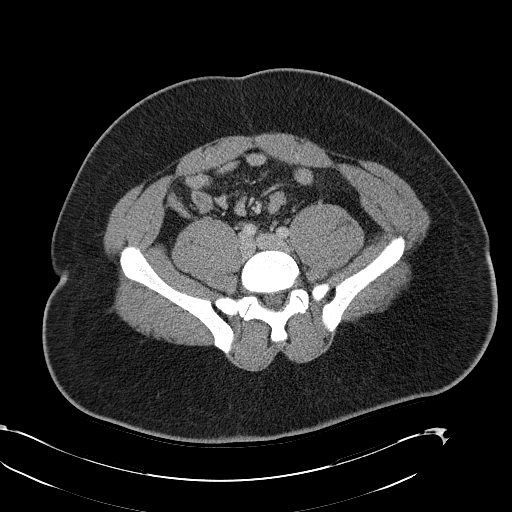
[im 47/93  soft-tissue]
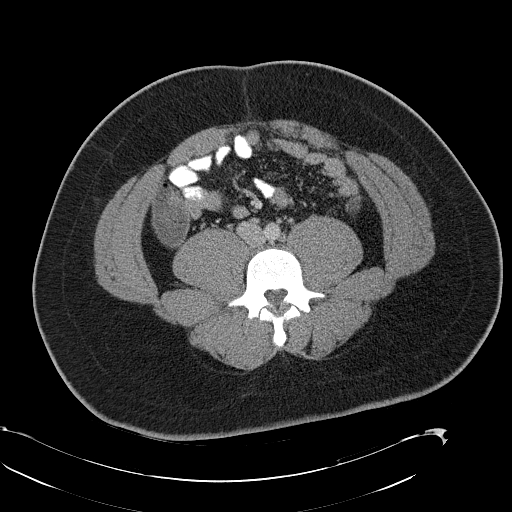
[im 55/93  soft-tissue]
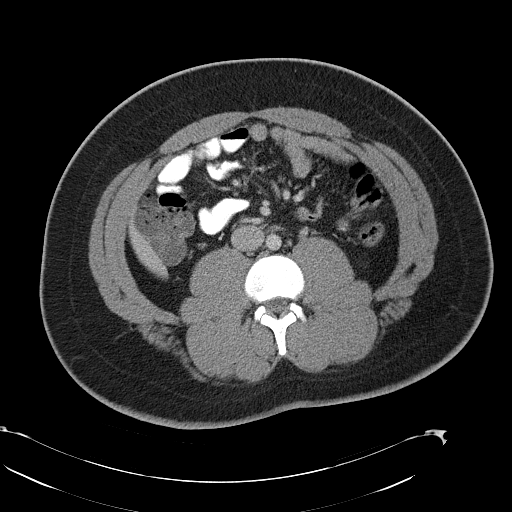
[im 59/93  soft-tissue]
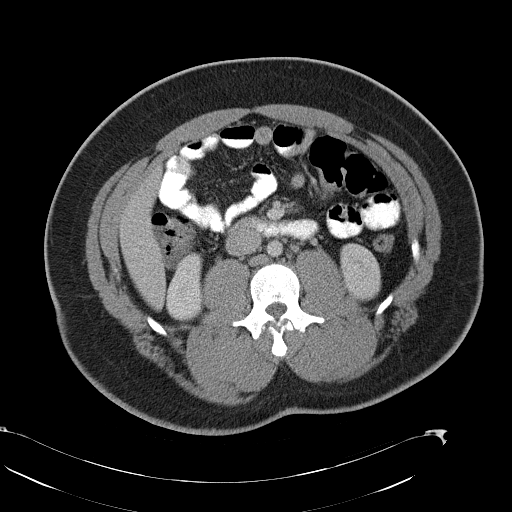
[im 59/93  bone]
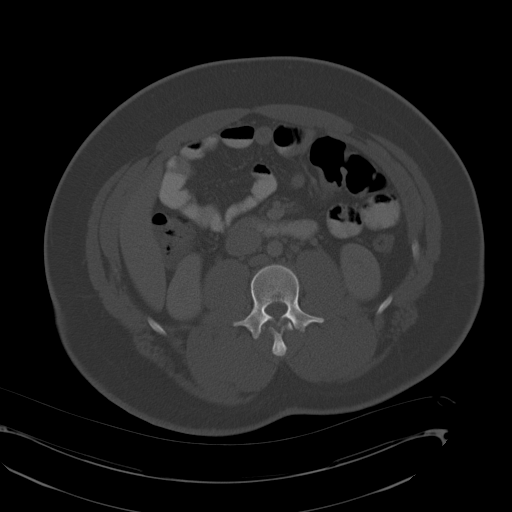
[im 67/93  soft-tissue]
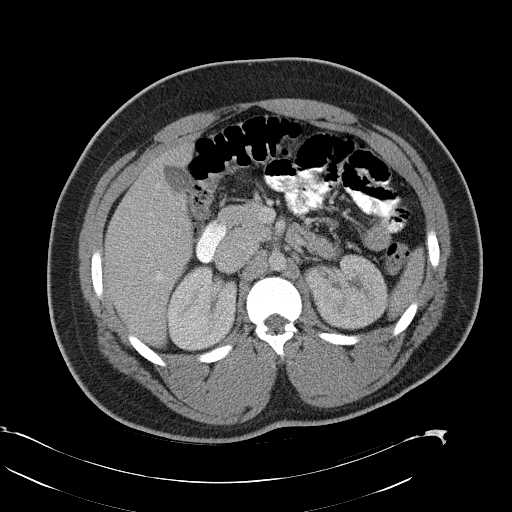
[im 72/93  soft-tissue]
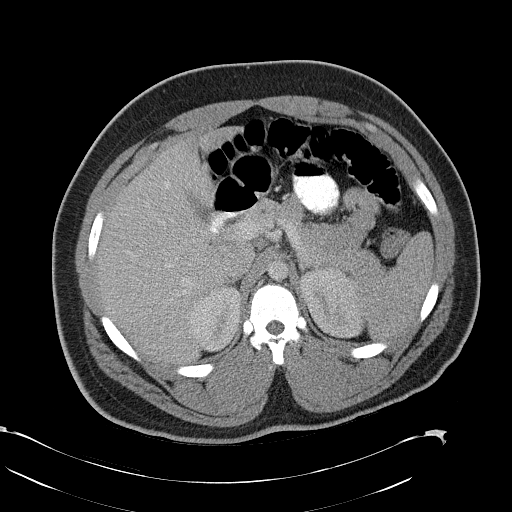
[im 80/93  soft-tissue]
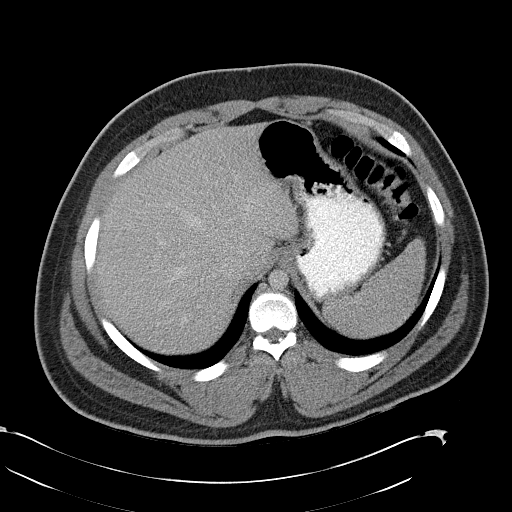
[im 88/93  soft-tissue]
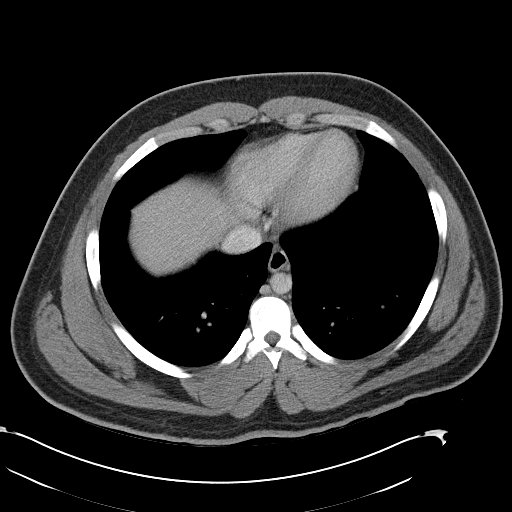

[Series 3: abd_pel_with 3.0 spo · coronal · 0.79mm/px · 3 of 99 slices shown]
[im 33/99  soft-tissue]
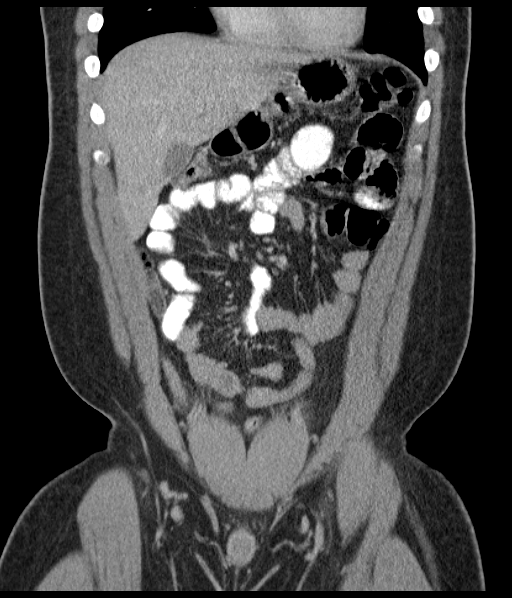
[im 44/99  soft-tissue]
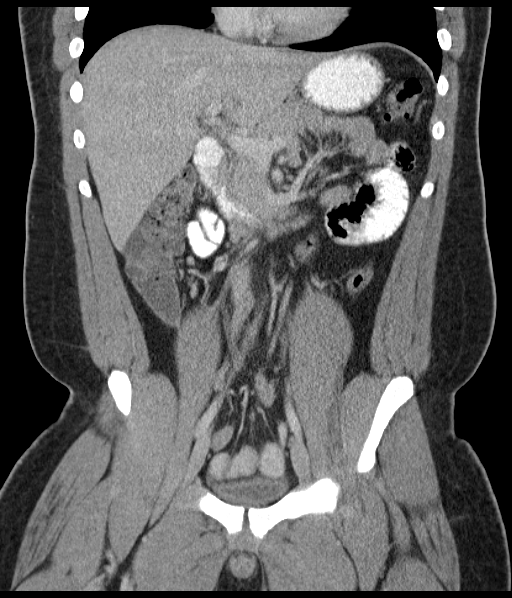
[im 55/99  soft-tissue]
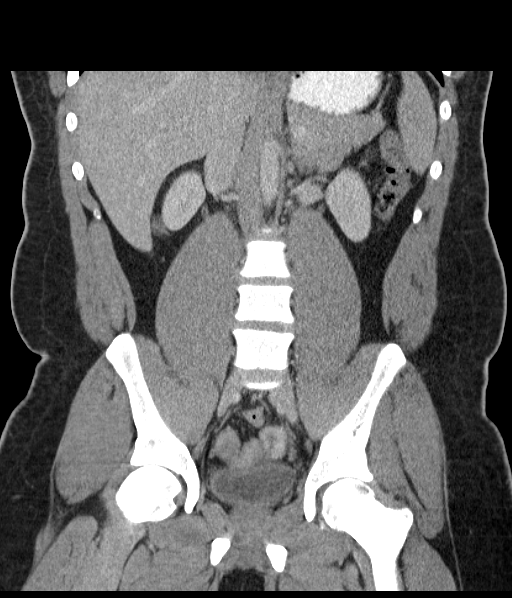

[16 of 46 positions shown; findings below may reference images not displayed]

FINDINGS: Lower chest:  Lung bases are clear.

Hepatobiliary: Liver is within normal limits.

Gallbladder is unremarkable. No intrahepatic or extrahepatic ductal
dilatation.

Spleen: Within normal limits.

Pancreas: Within normal limits.

Stomach/Bowel: Stomach is unremarkable.

No evidence of bowel obstruction.

Distal appendix is mildly dilated, measuring 9 mm, with associated
mild periappendiceal stranding/ inflammatory changes (series 2/image
64). This appearance is worrisome for early acute appendicitis.

No drainable fluid collection/ abscess.  No free air.

Adrenals/urinary tract: Adrenal glands are unremarkable.

Kidneys are within normal limits.  No hydronephrosis.

Bladder is underdistended.

Vascular/Lymphatic: No evidence of abdominal aortic aneurysm.

No suspicious abdominopelvic lymphadenopathy.

Reproductive: Process is unremarkable.

Musculoskeletal: Visualized osseous structures are within normal
limits.

Other: No abdominopelvic ascites.
IMPRESSION: Suspected early acute appendicitis.

No drainable fluid collection/abscess.  No free air.

## 2022-08-08 ENCOUNTER — Ambulatory Visit: Payer: Self-pay | Admitting: Internal Medicine

## 2022-09-26 ENCOUNTER — Ambulatory Visit: Payer: 59 | Admitting: Internal Medicine

## 2022-09-26 ENCOUNTER — Encounter: Payer: Self-pay | Admitting: Internal Medicine

## 2022-09-26 ENCOUNTER — Other Ambulatory Visit (HOSPITAL_BASED_OUTPATIENT_CLINIC_OR_DEPARTMENT_OTHER): Payer: Self-pay

## 2022-09-26 ENCOUNTER — Other Ambulatory Visit (HOSPITAL_COMMUNITY): Payer: Self-pay

## 2022-09-26 VITALS — BP 148/87 | HR 71 | Temp 97.9°F | Ht 68.0 in | Wt 326.8 lb

## 2022-09-26 DIAGNOSIS — R0683 Snoring: Secondary | ICD-10-CM | POA: Diagnosis not present

## 2022-09-26 DIAGNOSIS — R638 Other symptoms and signs concerning food and fluid intake: Secondary | ICD-10-CM

## 2022-09-26 DIAGNOSIS — Z119 Encounter for screening for infectious and parasitic diseases, unspecified: Secondary | ICD-10-CM | POA: Diagnosis not present

## 2022-09-26 DIAGNOSIS — I10 Essential (primary) hypertension: Secondary | ICD-10-CM | POA: Diagnosis not present

## 2022-09-26 DIAGNOSIS — Z0001 Encounter for general adult medical examination with abnormal findings: Secondary | ICD-10-CM | POA: Diagnosis not present

## 2022-09-26 DIAGNOSIS — Z833 Family history of diabetes mellitus: Secondary | ICD-10-CM

## 2022-09-26 DIAGNOSIS — Z0000 Encounter for general adult medical examination without abnormal findings: Secondary | ICD-10-CM

## 2022-09-26 LAB — CBC WITH DIFFERENTIAL/PLATELET
Basophils Absolute: 0 10*3/uL (ref 0.0–0.1)
Basophils Relative: 0.4 % (ref 0.0–3.0)
Eosinophils Absolute: 0.2 10*3/uL (ref 0.0–0.7)
Eosinophils Relative: 3.3 % (ref 0.0–5.0)
HCT: 47.1 % (ref 39.0–52.0)
Hemoglobin: 15.2 g/dL (ref 13.0–17.0)
Lymphocytes Relative: 32.8 % (ref 12.0–46.0)
Lymphs Abs: 1.9 10*3/uL (ref 0.7–4.0)
MCHC: 32.3 g/dL (ref 30.0–36.0)
MCV: 88.8 fl (ref 78.0–100.0)
Monocytes Absolute: 0.5 10*3/uL (ref 0.1–1.0)
Monocytes Relative: 8 % (ref 3.0–12.0)
Neutro Abs: 3.3 10*3/uL (ref 1.4–7.7)
Neutrophils Relative %: 55.5 % (ref 43.0–77.0)
Platelets: 253 10*3/uL (ref 150.0–400.0)
RBC: 5.3 Mil/uL (ref 4.22–5.81)
RDW: 13.3 % (ref 11.5–15.5)
WBC: 5.9 10*3/uL (ref 4.0–10.5)

## 2022-09-26 LAB — COMPREHENSIVE METABOLIC PANEL
ALT: 35 U/L (ref 0–53)
AST: 28 U/L (ref 0–37)
Albumin: 4.4 g/dL (ref 3.5–5.2)
Alkaline Phosphatase: 55 U/L (ref 39–117)
BUN: 14 mg/dL (ref 6–23)
CO2: 31 meq/L (ref 19–32)
Calcium: 9.7 mg/dL (ref 8.4–10.5)
Chloride: 100 meq/L (ref 96–112)
Creatinine, Ser: 1.09 mg/dL (ref 0.40–1.50)
GFR: 91.61 mL/min (ref >60.00)
Glucose, Bld: 82 mg/dL (ref 70–99)
Potassium: 4.1 meq/L (ref 3.5–5.1)
Sodium: 140 meq/L (ref 135–145)
Total Bilirubin: 0.2 mg/dL (ref 0.2–1.2)
Total Protein: 7.8 g/dL (ref 6.0–8.3)

## 2022-09-26 LAB — LIPID PANEL
Cholesterol: 191 mg/dL (ref 0–200)
HDL: 49.1 mg/dL (ref >39.00)
LDL Cholesterol: 96 mg/dL (ref 0–99)
NonHDL: 141.67
Total CHOL/HDL Ratio: 4
Triglycerides: 229 mg/dL — ABNORMAL HIGH (ref 0.0–149.0)
VLDL: 45.8 mg/dL — ABNORMAL HIGH (ref 0.0–40.0)

## 2022-09-26 LAB — HEMOGLOBIN A1C: Hgb A1c MFr Bld: 5.7 % (ref 4.6–6.5)

## 2022-09-26 MED ORDER — ZEPBOUND 7.5 MG/0.5ML ~~LOC~~ SOAJ
7.5000 mg | SUBCUTANEOUS | 0 refills | Status: AC
Start: 2022-11-21 — End: 2022-12-13
  Filled 2022-09-26: qty 2, 28d supply, fill #0

## 2022-09-26 MED ORDER — ZEPBOUND 5 MG/0.5ML ~~LOC~~ SOAJ
5.0000 mg | SUBCUTANEOUS | 0 refills | Status: AC
Start: 2022-10-24 — End: 2022-11-15
  Filled 2022-09-26: qty 2, 28d supply, fill #0

## 2022-09-26 MED ORDER — ZEPBOUND 10 MG/0.5ML ~~LOC~~ SOAJ
10.0000 mg | SUBCUTANEOUS | 0 refills | Status: AC
Start: 2022-12-19 — End: 2023-01-10
  Filled 2022-09-26: qty 2, 28d supply, fill #0

## 2022-09-26 MED ORDER — AMLODIPINE BESYLATE 5 MG PO TABS
5.0000 mg | ORAL_TABLET | Freq: Every day | ORAL | 3 refills | Status: AC
Start: 2022-09-26 — End: ?
  Filled 2022-09-26: qty 30, 30d supply, fill #0
  Filled 2022-10-28: qty 30, 30d supply, fill #1
  Filled 2022-12-05: qty 30, 30d supply, fill #2
  Filled 2023-02-20: qty 30, 30d supply, fill #3
  Filled 2023-04-26: qty 30, 30d supply, fill #4
  Filled 2023-06-05: qty 30, 30d supply, fill #5
  Filled 2023-07-03: qty 30, 30d supply, fill #6
  Filled 2023-08-30: qty 30, 30d supply, fill #7

## 2022-09-26 MED ORDER — ZEPBOUND 2.5 MG/0.5ML ~~LOC~~ SOAJ
2.5000 mg | SUBCUTANEOUS | 0 refills | Status: AC
Start: 2022-09-26 — End: 2022-10-18
  Filled 2022-09-26: qty 2, 28d supply, fill #0

## 2022-09-26 MED ORDER — ZEPBOUND 15 MG/0.5ML ~~LOC~~ SOAJ
15.0000 mg | SUBCUTANEOUS | 0 refills | Status: AC
Start: 2023-02-03 — End: 2023-02-25
  Filled 2022-09-26: qty 2, 28d supply, fill #0

## 2022-09-26 MED ORDER — ZEPBOUND 12.5 MG/0.5ML ~~LOC~~ SOAJ
12.5000 mg | SUBCUTANEOUS | 0 refills | Status: AC
Start: 2023-01-16 — End: 2023-02-07
  Filled 2022-09-26: qty 2, 28d supply, fill #0

## 2022-09-26 NOTE — Assessment & Plan Note (Addendum)
Reviewed available data from patient and  BP Readings from Last 3 Encounters:  09/26/22 (!) 148/87  10/04/14 149/67  10/04/13 (!) 130/52  Newly diagnosed with hypertension, he was educated on the importance of controlling blood pressure to prevent stroke and heart attack. We will start Amlodipine 5mg , half a tablet daily for one week and check blood pressure in one month. My individualized, goal average blood pressure for this patient, after considering the evidence for and against aggressive blood pressure goals as well as their past medical history and preferences, is 140/90 In my medical opinion, this problem is stable, poorly controlled  Discussed the need for medication refills and potential adjustments of the medication.  The patient confirmed they have sufficient refills of their current medications and prefer to maintain their current regimen at this time. We documented the discussion and scheduled a follow-up appointment to reassess their needs. Information for patient review: Please limit and avoid: salt, alcohol, NSAIDS, excess body weight, smoking, stress, sedentary lifestyles The risks of poor control over time are FUTURE stroke and heart attacks, but if you have a blood pressure over 180 and any red flag symptoms(headache/shortness of breath/confusion/chest discomfort) you should go to the ER.  You are encouraged to do home blood pressure monitoring, at least as many times per week as blood pressure medications you are on.  For example, bring a diary with 3 home blood pressure readings per week to each visit if you are on 3 blood pressure medications.   If you are on more than 3 medication(s) or have certain risk factors, we should do a resistant hypertension workup See AFTER VISIT SUMMARY for addition educational information provided Please let me know in advance when you need medication(s) refills, consistently taking your medication is very important!

## 2022-09-26 NOTE — Progress Notes (Signed)
[facility]: [phone]   -- Annual Preventive Medical Office Visit --  Patient:  Ricky Murphy      Age: 27 y.o.       Sex:  male  Date:   09/26/2022 Patient Care Team: Lula Olszewski, MD as PCP - General (Internal Medicine) Today's Healthcare Provider: Lula Olszewski, MD   Chief Complaint  Patient presents with   New Patient (Initial Visit)   Screening for diabetes    Family history, mother was recently diagnosed.   Weight Loss   Possible hypertension   Assessment & Plan Screening examination for infectious disease  Primary hypertension Reviewed available data from patient and  BP Readings from Last 3 Encounters:  09/26/22 (!) 148/87  10/04/14 149/67  10/04/13 (!) 130/52  Newly diagnosed with hypertension, he was educated on the importance of controlling blood pressure to prevent stroke and heart attack. We will start Amlodipine 5mg , half a tablet daily for one week and check blood pressure in one month. My individualized, goal average blood pressure for this patient, after considering the evidence for and against aggressive blood pressure goals as well as their past medical history and preferences, is 140/90 In my medical opinion, this problem is stable, poorly controlled  Discussed the need for medication refills and potential adjustments of the medication.  The patient confirmed they have sufficient refills of their current medications and prefer to maintain their current regimen at this time. We documented the discussion and scheduled a follow-up appointment to reassess their needs. Information for patient review: Please limit and avoid: salt, alcohol, NSAIDS, excess body weight, smoking, stress, sedentary lifestyles The risks of poor control over time are FUTURE stroke and heart attacks, but if you have a blood pressure over 180 and any red flag symptoms(headache/shortness of breath/confusion/chest discomfort) you should go to the ER.   You are encouraged to do home blood pressure monitoring, at least as many times per week as blood pressure medications you are on.  For example, bring a diary with 3 home blood pressure readings per week to each visit if you are on 3 blood pressure medications.   If you are on more than 3 medication(s) or have certain risk factors, we should do a resistant hypertension workup See AFTER VISIT SUMMARY for addition educational information provided Please let me know in advance when you need medication(s) refills, consistently taking your medication is very important!  Weight disorder He is overweight, with a large neck circumference and large tongue, factors that may contribute to hypertension and potential sleep apnea. The importance of weight loss and body composition correction was discussed. We will attempt to obtain Zepbound for weight loss and encourage regular exercise along with a diet rich in fish, extra virgin olive oil, avocados, nuts, and fiber.  He will increase resistance training to maintain muscle. FH: diabetes mellitus With both parents having diabetes and his overweight status increasing his risk, the importance of diabetes screening was discussed. Diabetes screening labs will be ordered as part of his wellness visit. Encounter for annual general medical examination with abnormal findings in adult  Preventative health care  Healthcare maintenance He is encouraged to have regular dental and vision exams. Wellness labs,will be ordered. A follow-up visit is scheduled in one month to review lab results and assess blood pressure control and weight loss progress.  Snoring His large tongue and thick neck may contribute to snoring and potential sleep apnea. The benefits of a sleep study and potential  CPAP therapy were discussed. A sleep study at [facility] will be ordered.   Diagnoses and all orders for this visit: Screening examination for infectious disease -     Hepatitis  C Antibody -     HIV antibody (with reflex) Primary hypertension -     CBC w/Diff -     Comp Met (CMET) -     Lipid panel -     HgB A1c -     TSH Rfx on Abnormal to Free T4 -     amLODipine (NORVASC) 5 MG tablet; Take 1 tablet (5 mg total) by mouth daily. Start at half tablet daily for 1 week -     Ambulatory referral to Sleep Studies Weight disorder -     TSH Rfx on Abnormal to Free T4 -     tirzepatide (ZEPBOUND) 2.5 MG/0.5ML Pen; Inject 2.5 mg into the skin once a week for 4 doses -     tirzepatide (ZEPBOUND) 5 MG/0.5ML Pen; Inject 5 mg into the skin once a week for 4 doses. Ok to go up to this dose after weeks stable on the dose 2.5 mg lower than this -     tirzepatide (ZEPBOUND) 7.5 MG/0.5ML Pen; Inject 7.5 mg into the skin once a week for 4 doses. Ok to go up to this dose after weeks stable on the dose 2.5 mg lower than this -     tirzepatide (ZEPBOUND) 10 MG/0.5ML Pen; Inject 10 mg into the skin once a week for 4 doses. Ok to go up to this dose after weeks stable on the dose 2.5 mg lower than this -     tirzepatide (ZEPBOUND) 12.5 MG/0.5ML Pen; Inject 12.5 mg into the skin once a week for 4 doses. Ok to go up to this dose after weeks stable on the dose 2.5 mg lower than this -     tirzepatide (ZEPBOUND) 15 MG/0.5ML Pen; Inject 15 mg into the skin once a week for 4 doses. Ok to go up to this dose after weeks stable on the dose 2.5 mg lower than this -     Ambulatory referral to Sleep Studies FH: diabetes mellitus -     HgB A1c Encounter for annual general medical examination with abnormal findings in adult Preventative health care Healthcare maintenance Snoring -     Ambulatory referral to Sleep Studies    Today's Health Maintenance Counseling and Anticipatory Guidance:  Eye exams:  every 1-2 years recommended.  He has not done but he has vision insurance  Dental health: Discussed importance of regular tooth brushing, flossing, and dental visits q6 months.  Poor dentition can  lead to serious medical problems - particularly problems with heart valves.  Mr.Snowdon reports he has been keeping up with his dental visits.  He intends to do so in the future.  Sinus health: Encourage sterile saline nasal misting sinus rinses daily for pollen, to reduce allergies and risk for sinus infections.   Sterile can based misting products are recommended due to superior misting and ease of maintaining sterility . Sleep Apnea screening:  He  denies any significant problems with sleep quality or hypersomnolence or being advised that he has been told that he snores or has apnea STOP-Bang Score (scored NO unless checked) [x]  Do you snore loudly [x]  Tired, fatigued, or sleepy during the daytime []  Witness apneas []  Significant hypertension []  BMI greater than 35    []  Age older than 29 years old [x]  Has  large neck size over 15.7 in [x]  Male Total Score: 4  Cardiovascular Risk Factor Reduction:   Advised patient of need for regular exercise and diet rich and fruits and vegetables and healthy fats to reduce risk of heart attack and stroke.  Avoid first- and second-hand smoke and stimulants.   Avoid extreme exercise- exercise in moderation (150 minutes per week is a good goal) Wt Readings from Last 3 Encounters:  09/26/22 (!) 326 lb 12.8 oz (148.2 kg)  10/04/14 265 lb (120.2 kg)  10/03/13 245 lb (111.1 kg)  Body mass index is 49.69 kg/m. He reports his diet consists of 2 heavy meals He reports his exercise includes of sedentary Health maintenance and immunizations reviewed and he was encouraged to complete anything that is due: Immunization History  Administered Date(s) Administered   Tdap 09/05/2019   Health Maintenance Due  Topic Date Due   HIV Screening  Never done   Hepatitis C Screening  Never done    Sexual transmitted infection screening: testing offered today, but patient declined as he feels he is low risk based on his sexual history.   Social History   Substance and  Sexual Activity  Sexual Activity Yes   Birth control/protection: None     Substance use:  I discussed that my recommendation is total abstinence from all substances of abuse including smoke and 2nd hand smoke, alcohol, illicit drugs, smoking, inhalants, sugar.   Offered to assist with any use disorders or addictions.   Injury prevention: Discussed safety belts, safety helmets, smoke detectors. Cancer Screening: Penile/Testicle/Scrotum cancer screening: Asked the patient about genital warts or tumors/abnormalities of penis/testicles/scrotum, encouraged patient to to inform me of any. Patient reports there are none. Thyroid cancer screening: patient advised to check by palpating thyroid for nodules Prostate cancer screening:  Denies family history of prostate cancer or hematospermia so too young for screening by current guidelines.(No results found for: "PSA") Colon cancer screening: Denies strong family history of colon cancer or blood in stool so no screening is indicated until age 74.   Lung cancer screening:  Current guidelines recommend Individuals aged 3 to 43 who currently smoke or formerly smoked and have a ? 20 pack-year smoking history should undergo annual screening with low-dose computed tomography (LDCT). Skin cancer screening-  Advised regular sunscreen use. He denies worrisome, changing, or new skin lesions. Showed him pictures of melanomas for reference:  Return to care in 1 year for next preventative visit.   Subjective  AI-Extracted: Discussed the use of AI scribe software for clinical note transcription with the patient, who gave verbal consent to proceed.  History of Present Illness   The 27 year old patient presented to establish primary care due to concerns about personal health risks, particularly in relation to a family history of diabetes and personal history of obesity and hypertension. The patient's father has had diabetes for the past 20 years, and his mother was  recently diagnosed with the same condition. The patient is aware of being overweight and has expressed a desire for weight loss.  The patient reported experiencing high blood pressure at home, which was confirmed during the visit. The patient also reported a history of snoring and suspected sleep apnea, which was corroborated by his spouse.  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  The patient has a family history of breast cancer on the maternal side. He denied any substance use issues, risky behaviors, or sexually transmitted infection risks. The patient's tetanus vaccination is up-to-date, having been administered in 2021.  In summary, the patient's primary concerns are his weight, high blood pressure, and potential risk for diabetes, given his family history. The patient is also aware of potential sleep apnea due to his snoring and is open to further investigation and treatment.      Review of Systems  Constitutional:  Negative for chills, diaphoresis, fever, malaise/fatigue and weight loss.  HENT:  Negative for congestion, ear discharge, ear pain, hearing loss, nosebleeds, sinus pain, sore throat and tinnitus.   Eyes:  Negative for blurred vision, double vision, photophobia, pain, discharge and redness.  Respiratory:  Negative for cough, hemoptysis, sputum production, shortness of breath, wheezing and stridor.   Cardiovascular:  Negative for chest pain, palpitations, orthopnea, claudication, leg swelling and PND.  Gastrointestinal:  Negative for abdominal pain, blood in stool, constipation, diarrhea, heartburn, melena, nausea and vomiting.  Genitourinary:  Negative for dysuria, flank pain, frequency, hematuria and urgency.  Musculoskeletal:   Negative for back pain, falls, joint pain, myalgias and neck pain.  Skin:  Negative for itching and rash.  Neurological:  Negative for dizziness, tingling, tremors, sensory change, speech change, focal weakness, seizures, loss of consciousness, weakness and headaches.  Endo/Heme/Allergies:  Negative for environmental allergies and polydipsia. Does not bruise/bleed easily.  Psychiatric/Behavioral:  Negative for depression, hallucinations, memory loss, substance abuse and suicidal ideas. The patient is not nervous/anxious and does not have insomnia.      Disclaimer about ROS at Annual Preventive Visits Patients are informed before the Review of Systems (ROS) that identifying significant medical issues during the wellness visit may require immediate attention, potentially resulting in a separate billable encounter beyond the scope of the preventive exam. This disclosure is mandated by professional ethics and legal obligations, as healthcare providers must address any substantial health concerns raised during any patient interaction. A comprehensive ROS is required by insurance companies for billing the visit. However, this structure may inadvertently discourage patients from fully disclosing health concerns due to potential financial implications. Consequently, patients often emphasize that any positive ROS findings are related to stable chronic conditions, requesting that these not be discussed during the preventive visit to avoid additional charges. Patients may also ask that reported complaints not be listed in the ROS to prevent affecting billing.  Problem list overviews that were updated at today's visit: Problem  Weight Disorder   Preferred diets:  Preferred exercise: Medications tried:   No results found for: "TSH" No results found for: "HGBA1C" Wt Readings from Last 10 Encounters:  09/26/22 (!) 326 lb 12.8 oz (148.2 kg)  10/04/14 265 lb (120.2 kg)  10/03/13 245 lb (111.1 kg)  Body mass  index is 49.69 kg/m.    Hypertension  Fh: Diabetes Mellitus  Acute Appendicitis (Resolved)   I attest that I have reviewed and confirmed the patients current medications to meet the medication reconciliation requirement  Current Outpatient Medications on File Prior to Visit  Medication Sig   cyclobenzaprine (FLEXERIL) 10 MG tablet Take 1 tablet (10 mg total) by mouth 2 (two) times daily as needed for muscle spasms.   naproxen (NAPROSYN) 500 MG tablet Take 1 tablet (500 mg total) by mouth 2 (two) times daily.   oxyCODONE-acetaminophen (PERCOCET) 7.5-325 MG per tablet Take 1-2 tablets by mouth every 4 (four)  hours as needed.   No current facility-administered medications on file prior to visit.  There are no discontinued medications. The following were reviewed and entered/updated into our electronic MEDICAL RECORD NUMBER   09/26/2022    9:15 AM  Depression screen PHQ 2/9  Decreased Interest 0  Down, Depressed, Hopeless 0  PHQ - 2 Score 0  Altered sleeping 0  Tired, decreased energy 3  Change in appetite 2  Feeling bad or failure about yourself  0  Trouble concentrating 0  Moving slowly or fidgety/restless 0  Suicidal thoughts 0  PHQ-9 Score 5  Difficult doing work/chores Not difficult at all   Past Medical History:  Diagnosis Date   Acute appendicitis 10/03/2013   Allergy    Patient Active Problem List   Diagnosis Date Noted   Weight disorder 09/26/2022   Hypertension 09/26/2022   FH: diabetes mellitus 09/26/2022   Past Surgical History:  Procedure Laterality Date   APPENDECTOMY     APPENDECTOMY  2015   LAPAROSCOPIC APPENDECTOMY N/A 10/03/2013   Procedure: APPENDECTOMY LAPAROSCOPIC;  Surgeon: Dalia Heading, MD;  Location: [facility];  Service: General;  Laterality: N/A;   Family History  Problem Relation Age of Onset   Diabetes Mother    Alcohol abuse Father    COPD Father    Diabetes Father    Seizures Sister    Learning disabilities Brother    Drug abuse Brother     Early death Maternal Grandmother    Cancer Maternal Grandmother    Diabetes Maternal Grandfather    Mental illness Maternal Grandfather    Diabetes Paternal Grandmother    Allergies  Allergen Reactions   Coconut Flavor Shortness Of Breath   Social History   Tobacco Use   Smoking status: Never   Smokeless tobacco: Never  Vaping Use   Vaping status: Never Used  Substance Use Topics   Alcohol use: Yes    Alcohol/week: 6.0 standard drinks of alcohol    Types: 6 Cans of beer per week    Comment: occasionally   Drug use: Yes    Types: Marijuana     Objective  BP (!) 148/87 (BP Location: Left Arm, Patient Position: Sitting)   Pulse 71   Temp 97.9 F (36.6 C) (Temporal)   Ht 5\' 8"  (1.727 m)   Wt (!) 326 lb 12.8 oz (148.2 kg)   SpO2 98%   BMI 49.69 kg/m   Body mass index is 49.69 kg/m. Wt Readings from Last 3 Encounters:  09/26/22 (!) 326 lb 12.8 oz (148.2 kg)  10/04/14 265 lb (120.2 kg)  10/03/13 245 lb (111.1 kg)   GEN: NAD, Resting Comfortably. Severe truncal adiposity HEENT: Tympanic membranes normal appearing bilaterally, Oropharynx clear, No Thyromegaly noted. No palpable lymphadenopathy or thyroid nodules. Nasal mucosal ulcer left anterior  septum CARDIOVASCULAR: S1 and S2 heart sounds have regular rate and rhythm with no murmurs appreciated. PULMONARY:  Normal work of breathing. Clear to auscultation bilaterally with no crackles, wheezes, or rhonchi. ABDOMEN: Soft, Nontender, Nondistended.  MSK: No edema, cyanosis, or clubbing noted. SKIN: Warm, dry, no lesions of concern observed. NEURO: CN2-12 grossly intact. Strength 5/5 in upper and lower extremities. Reflexes symmetric and intact bilaterally.  PSYCH: Normal affect and thought content, pleasant and cooperative.

## 2022-09-26 NOTE — Patient Instructions (Addendum)
VISIT SUMMARY:  During your visit, we discussed your concerns about your weight, high blood pressure, and potential risk for diabetes due to your family history. We also discussed your snoring, which could be a sign of sleep apnea. We have made a plan to address these issues and improve your overall health.  YOUR PLAN:  -HIGH BLOOD PRESSURE: You have been diagnosed with high blood pressure, which can increase the risk of stroke and heart attack. We will start you on a medication called Amlodipine to help control your blood pressure.  -WEIGHT: Your weight and body shape may be contributing to your high blood pressure and potential sleep apnea. We will try to get you a medication called Zepbound for weight loss, and we encourage you to exercise regularly and eat a diet rich in healthy fats and fiber.  -POTENTIAL SLEEP APNEA: Your snoring and physical characteristics suggest you may have sleep apnea, a condition where breathing stops and starts during sleep. We will arrange for you to have a sleep study to confirm this.  -RISK OF DIABETES: Given your family history and your weight, you are at risk of developing diabetes. We will order some tests to check for this.  INSTRUCTIONS:  Please continue to have regular dental and vision exams. We will order some wellness labs, including tests for cancer. We have scheduled a follow-up visit in one month to review your lab results and check on your progress with controlling your blood pressure and losing weight.  Welcome aboard!   Today's visit was a valuable first step in understanding your health and starting your personalized care journey. We discussed your medical history and medications in detail. Given the extensive information, we prioritized addressing your most pressing concerns.  We understood those concerns to be:  New Patient (Initial Visit), Screening for diabetes (Family history, mother was recently diagnosed.), Weight Loss, and Possible  hypertension   Building a Complete Picture  To create the most effective care plan possible, we may need additional information from previous providers. We encouraged you to gather any relevant medical records for your next visit. This will help [place] build a more complete picture and develop a personalized plan together. In the meantime, we'll address your immediate concerns and provide resources to help you manage all of your medical issues.  We encourage you to use MyChart to review these efforts, and to help [place] find and correct any omissions or errors in your medical chart.  Managing Your Health Over Time  Managing every aspect of your health in a single visit isn't always feasible, but that's okay.  We addressed your most pressing concerns today and charted a course for future care. Acute conditions or preventive care measures may require further attention.  We encourage you to schedule a follow-up visit at your earliest convenience to discuss any unresolved issues.  We strongly encourage participation in annual preventive care visits to help [place] develop a more thorough understanding of your health and to help you maintain optimal wellness - please inquire about scheduling your next one with [place] at your earliest convenience.  Your Satisfaction Matters  It was a pleasure seeing you today!  Your health and satisfaction will always be my top priorities. If you believe your experience today was worthy of a 5-star rating, I'd be grateful for your feedback!  Lula Olszewski, MD  Next Steps  Schedule Follow-Up:  We recommend a follow-up appointment in 1 year for your next wellness visit.  If you develop any  new problems, want to address any medical issues, or your condition worsens before then, please call us for an appointment or seek emergency care. Preventive Care:  Don't forget to schedule your annual preventive care visit!  Please review your attached preventive care information. Make sure to  arrange appointments for dental and vision routine screening, and use nightly nasal saline mist to keep your sinuses clear. Medical Information Release:  For any relevant medical information we don't have, please sign a release form at the front desk so we can obtain it for your records. Lab & X-ray Appointments:  Scheduled any incomplete lab tests today or call us to schedule.  X-Rays can be done without an appointment at [facility] at [facility] ([address], [address]), M-F 8:30am-noon or 1pm-5pm.  Just tell them you're there for X-rays ordered by Dr. Jon Billings.  We'll receive the results and contact you by phone or MyChart to discuss next steps.  Making the Most of Our Focused (20 minute) Appointments:  [x]   Clearly state your top concerns at the beginning of the visit to focus our discussion [x]   If you anticipate you will need more time, please inform the front desk during scheduling - we can book multiple appointments in the same week. [x]   If you have transportation problems- use our convenient video appointments or ask about transportation support. [x]   We can get down to business faster if you use MyChart to update information before the visit and submit non-urgent questions before your visit. Thank you for taking the time to provide details through MyChart.  Let our nurse know and she can import this information into your encounter documents.  Arrival and Wait Times: [x]   Arriving on time ensures that everyone receives prompt attention. [x]   Early morning (8a) and afternoon (1p) appointments tend to have shortest wait times. [x]   Unfortunately, we cannot delay appointments for late arrivals or hold slots during phone calls.  Thank you for collaborating with [place] to prioritize your health. We look forward to serving you!  Bring to Your Next Appointment  Medications: Please bring all your medication bottles to your next appointment to ensure we have an accurate record of your  prescriptions. Health Diaries: If you're monitoring any health conditions at home, keeping a diary of your readings can be very helpful for discussions at your next appointment.  Reviewing Your Records  Please Review this early draft of your clinical notes below and the final encounter summary tomorrow on MyChart after its been completed.   Encounter for annual general medical examination with abnormal findings in adult  Screening examination for infectious disease -     Hepatitis C antibody -     HIV Antibody (routine testing w rflx)  Primary hypertension Assessment & Plan: Reviewed available data from patient and  BP Readings from Last 3 Encounters:  09/26/22 (!) 148/87  10/04/14 149/67  10/04/13 (!) 130/52   My individualized, goal average blood pressure for this patient, after considering the evidence for and against aggressive blood pressure goals as well as their past medical history and preferences, is 140/90 In my medical opinion, this problem is stable, poorly controlled  Discussed the need for medication refills and potential adjustments of the medication.  The patient confirmed they have sufficient refills of their current medications and prefer to maintain their current regimen at this time. We documented the discussion and scheduled a follow-up appointment to reassess their needs. Information for patient review: Please limit and avoid:  salt, alcohol, NSAIDS, excess body weight, smoking, stress, sedentary lifestyles The risks of poor control over time are FUTURE stroke and heart attacks, but if you have a blood pressure over 180 and any red flag symptoms(headache/shortness of breath/confusion/chest discomfort) you should go to the ER.  You are encouraged to do home blood pressure monitoring, at least as many times per week as blood pressure medications you are on.  For example, bring a diary with 3 home blood pressure readings per week to each visit if you are on 3 blood  pressure medications.   If you are on more than 3 medication(s) or have certain risk factors, we should do a resistant hypertension workup See AFTER VISIT SUMMARY for addition educational information provided Please let me know in advance when you need medication(s) refills, consistently taking your medication is very important!   Orders: -     CBC with Differential/Platelet -     Comprehensive metabolic panel -     Lipid panel -     Hemoglobin A1c -     TSH Rfx on Abnormal to Free T4 -     amLODIPine Besylate; Take 1 tablet (5 mg total) by mouth daily. Start at half tablet daily for 1 week  Dispense: 90 tablet; Refill: 3 -     Ambulatory referral to Sleep Studies  Weight disorder Assessment & Plan: Discussed zepbound and resistance training  Orders: -     TSH Rfx on Abnormal to Free T4 -     Zepbound; Inject 2.5 mg into the skin once a week for 4 doses  Dispense: 2 mL; Refill: 0 -     Zepbound; Inject 5 mg into the skin once a week for 4 doses. Ok to go up to this dose after weeks stable on the dose 2.5 mg lower than this  Dispense: 2 mL; Refill: 0 -     Zepbound; Inject 7.5 mg into the skin once a week for 4 doses. Ok to go up to this dose after weeks stable on the dose 2.5 mg lower than this  Dispense: 2 mL; Refill: 0 -     Zepbound; Inject 10 mg into the skin once a week for 4 doses. Ok to go up to this dose after weeks stable on the dose 2.5 mg lower than this  Dispense: 2 mL; Refill: 0 -     Zepbound; Inject 12.5 mg into the skin once a week for 4 doses. Ok to go up to this dose after weeks stable on the dose 2.5 mg lower than this  Dispense: 2 mL; Refill: 0 -     Zepbound; Inject 15 mg into the skin once a week for 4 doses. Ok to go up to this dose after weeks stable on the dose 2.5 mg lower than this  Dispense: 2 mL; Refill: 0 -     Ambulatory referral to Sleep Studies  FH: diabetes mellitus Assessment & Plan: Will screen  Orders: -     Hemoglobin A1c  Preventative health  care  Healthcare maintenance  Snoring -     Ambulatory referral to Sleep Studies     Getting Answers and Following Up  Simple Questions & Concerns: For quick questions or basic follow-up after your visit, reach [place] at [phone] or MyChart messaging. Complex Concerns: If your concern is more complex, scheduling an appointment might be best. Discuss this with the staff to find the most suitable option. Lab & Imaging Results: We'll contact you  directly if results are abnormal or you don't use MyChart. Most normal results will be on MyChart within 2-3 business days, with a review message from Dr. Jon Billings. Haven't heard back in 2 weeks? Need results sooner? Contact us at [phone]. Referrals: Our referral coordinator will manage specialist referrals. The specialist's office should contact you within 2 weeks to schedule an appointment. Call us if you haven't heard from them after 2 weeks.  Staying Connected  MyChart: Activate your MyChart for the fastest way to access results and message [place]. See the last page of this paperwork for instructions on how to activate.  Billing  X-ray & Lab Orders: These are billed by separate companies. Contact the invoicing company directly for questions or concerns. Visit Charges: Discuss any billing inquiries with our administrative services team.  Feedback & Satisfaction  Share Your Experience: We strive for your satisfaction! If you have any complaints, or preferably compliments, please let Dr. Jon Billings know directly or contact our Practice Administrators, Edwena Felty or [employer], by asking at the front desk.   Scheduling Tips  Shorter Wait Times: 8 am and 1 pm appointments often have the quickest wait times. Longer Appointments: If you need more time during your visit, talk to the front desk. Due to insurance regulations, multiple back-to-back appointments might be necessary.

## 2022-09-26 NOTE — Assessment & Plan Note (Addendum)
With both parents having diabetes and his overweight status increasing his risk, the importance of diabetes screening was discussed. Diabetes screening labs will be ordered as part of his wellness visit.

## 2022-09-26 NOTE — Assessment & Plan Note (Addendum)
He is overweight, with a large neck circumference and large tongue, factors that may contribute to hypertension and potential sleep apnea. The importance of weight loss and body composition correction was discussed. We will attempt to obtain Zepbound for weight loss and encourage regular exercise along with a diet rich in fish, extra virgin olive oil, avocados, nuts, and fiber.  He will increase resistance training to maintain muscle.

## 2022-09-27 ENCOUNTER — Other Ambulatory Visit (HOSPITAL_BASED_OUTPATIENT_CLINIC_OR_DEPARTMENT_OTHER): Payer: Self-pay

## 2022-09-27 LAB — HEPATITIS C ANTIBODY: Hepatitis C Ab: NONREACTIVE

## 2022-09-27 LAB — HIV ANTIBODY (ROUTINE TESTING W REFLEX): HIV 1&2 Ab, 4th Generation: NONREACTIVE

## 2022-09-27 LAB — TSH RFX ON ABNORMAL TO FREE T4: TSH: 2.46 u[IU]/mL (ref 0.450–4.500)

## 2022-09-27 NOTE — Progress Notes (Signed)
If patient doesn't review MyChart comments within a week or two, mail out the following:  I've reviewed the results and they are all essentially normal, except for cholesterol levels.  HDL Cholesterol was 49.10 4: this is good cholesterol we want to raise the levels as high as possible.  The longest lived individuals typically have levels over 100. LDL cholesterol levels were LDLcalc 96 this is bad cholesterol that forms plaques in the arteries that cause strokes and heart attacks, we want the levels as low as possible.  People with history of cardiovascular disease take medicine to get to a goal of 50, but lower might be even better. Triglycerides Levels were 229.0 these contribute to cardiovascular disease and gallstones, we want the levels as low as possible.  Low carb diets and weight loss are very effective at keeping this low.  While we'll discuss the details at your next appointment, we wanted to share some initial thoughts and next steps.  Diet: Focus on avoiding foods that have saturated fats, high cholesterol, or high levels of carbohydrates like sugar, starch, and grains.  Even more important, be sure to eat lots of healthy omega-3 unsaturated fats like Extra Virgin Olive Oil, fatty fish, nuts, and avocados.  Its safe to take omega-3 supplements. Exercise: Regular physical activity is crucial for overall health and can benefit your cholesterol. Weight Management: If you're overweight or obese, reaching a healthy weight can positively impact your cholesterol. Potential Medication: Depending on your individual risk factors, we might explore the benefits of medications to further manage your cholesterol and heart health.  We'll discuss this in detail at your next visit.  Collaboration is Key: We value your active participation in your care plan. At your next appointment, we can discuss your questions and preferences to create a personalized approach that works best for you.  Next Steps: We  encourage you to make any suggested lifestyle changes as described above We encourage you to get cholesterol rechecked in 6-12 months If you're interested, we could have an extra appointment to: (1) Review your cholesterol results in more detail. (2) Discuss personalized lifestyle changes and potential medication options. (3) Answer your questions and address any concerns you may have. (4) Discuss additional cholesterol testing options and cardiac risk evaluation tests that are available  In the meantime, you can find some resources on heart-healthy living at [email or web address] Please let us know if you have any questions before your next appointment.

## 2022-10-11 ENCOUNTER — Other Ambulatory Visit (HOSPITAL_COMMUNITY): Payer: Self-pay

## 2022-10-11 ENCOUNTER — Telehealth: Payer: Self-pay

## 2022-10-11 NOTE — Telephone Encounter (Signed)
Pharmacy Patient Advocate Encounter   Received notification from CoverMyMeds that prior authorization for Zepbound 2.5mg /0.67ml is required/requested.   Insurance verification completed.   The patient is insured through [facility] .   Per test claim: PA required; PA submitted to [facility] via CoverMyMeds Key/confirmation #/[id number] Status is pending

## 2022-10-12 NOTE — Telephone Encounter (Signed)
Pharmacy Patient Advocate Encounter  Received notification from [facility] that Prior Authorization for Zepbound 2.5mg /0.68ml has been DENIED.  See denial reason below. No denial letter attached in CMM. Will attache denial letter to Media tab once received.   PA #/Case ID/Reference #: [id number]

## 2022-10-13 NOTE — Telephone Encounter (Signed)
Sent my chart message informing patient of this denial.

## 2022-10-18 ENCOUNTER — Other Ambulatory Visit (HOSPITAL_BASED_OUTPATIENT_CLINIC_OR_DEPARTMENT_OTHER): Payer: Self-pay

## 2022-10-26 ENCOUNTER — Ambulatory Visit: Payer: 59 | Admitting: Internal Medicine

## 2022-10-29 ENCOUNTER — Other Ambulatory Visit (HOSPITAL_BASED_OUTPATIENT_CLINIC_OR_DEPARTMENT_OTHER): Payer: Self-pay

## 2023-02-20 ENCOUNTER — Other Ambulatory Visit (HOSPITAL_BASED_OUTPATIENT_CLINIC_OR_DEPARTMENT_OTHER): Payer: Self-pay

## 2023-04-26 ENCOUNTER — Other Ambulatory Visit (HOSPITAL_BASED_OUTPATIENT_CLINIC_OR_DEPARTMENT_OTHER): Payer: Self-pay

## 2023-06-05 ENCOUNTER — Other Ambulatory Visit (HOSPITAL_BASED_OUTPATIENT_CLINIC_OR_DEPARTMENT_OTHER): Payer: Self-pay

## 2023-07-03 ENCOUNTER — Other Ambulatory Visit (HOSPITAL_BASED_OUTPATIENT_CLINIC_OR_DEPARTMENT_OTHER): Payer: Self-pay

## 2023-08-30 ENCOUNTER — Other Ambulatory Visit (HOSPITAL_BASED_OUTPATIENT_CLINIC_OR_DEPARTMENT_OTHER): Payer: Self-pay
